# Patient Record
Sex: Female | Born: 1965 | Race: White | Hispanic: No | Marital: Married | Smoking: Current every day smoker
Health system: Southern US, Community
[De-identification: ages and names within clinical notes are randomized; demographics above are authoritative.]

## PROBLEM LIST (undated history)

## (undated) DIAGNOSIS — M069 Rheumatoid arthritis, unspecified: Secondary | ICD-10-CM

## (undated) DIAGNOSIS — F419 Anxiety disorder, unspecified: Secondary | ICD-10-CM

## (undated) DIAGNOSIS — M1712 Unilateral primary osteoarthritis, left knee: Secondary | ICD-10-CM

## (undated) DIAGNOSIS — K219 Gastro-esophageal reflux disease without esophagitis: Secondary | ICD-10-CM

## (undated) DIAGNOSIS — G43909 Migraine, unspecified, not intractable, without status migrainosus: Secondary | ICD-10-CM

## (undated) DIAGNOSIS — I509 Heart failure, unspecified: Secondary | ICD-10-CM

## (undated) DIAGNOSIS — G4730 Sleep apnea, unspecified: Secondary | ICD-10-CM

## (undated) DIAGNOSIS — R51 Headache: Secondary | ICD-10-CM

## (undated) DIAGNOSIS — M797 Fibromyalgia: Secondary | ICD-10-CM

## (undated) DIAGNOSIS — Z9289 Personal history of other medical treatment: Secondary | ICD-10-CM

## (undated) DIAGNOSIS — I10 Essential (primary) hypertension: Secondary | ICD-10-CM

## (undated) DIAGNOSIS — F32A Depression, unspecified: Secondary | ICD-10-CM

## (undated) DIAGNOSIS — E785 Hyperlipidemia, unspecified: Secondary | ICD-10-CM

## (undated) DIAGNOSIS — J449 Chronic obstructive pulmonary disease, unspecified: Secondary | ICD-10-CM

## (undated) DIAGNOSIS — K589 Irritable bowel syndrome without diarrhea: Secondary | ICD-10-CM

## (undated) DIAGNOSIS — N390 Urinary tract infection, site not specified: Secondary | ICD-10-CM

## (undated) DIAGNOSIS — M359 Systemic involvement of connective tissue, unspecified: Secondary | ICD-10-CM

## (undated) DIAGNOSIS — F329 Major depressive disorder, single episode, unspecified: Secondary | ICD-10-CM

## (undated) DIAGNOSIS — L439 Lichen planus, unspecified: Secondary | ICD-10-CM

## (undated) DIAGNOSIS — E119 Type 2 diabetes mellitus without complications: Secondary | ICD-10-CM

## (undated) HISTORY — DX: Major depressive disorder, single episode, unspecified: F32.9

## (undated) HISTORY — DX: Gastro-esophageal reflux disease without esophagitis: K21.9

## (undated) HISTORY — DX: Chronic obstructive pulmonary disease, unspecified: J44.9

## (undated) HISTORY — DX: Unilateral primary osteoarthritis, left knee: M17.12

## (undated) HISTORY — DX: Essential (primary) hypertension: I10

## (undated) HISTORY — DX: Migraine, unspecified, not intractable, without status migrainosus: G43.909

## (undated) HISTORY — DX: Depression, unspecified: F32.A

## (undated) HISTORY — DX: Urinary tract infection, site not specified: N39.0

## (undated) HISTORY — DX: Fibromyalgia: M79.7

## (undated) HISTORY — PX: VAGINAL HYSTERECTOMY: SUR661

## (undated) HISTORY — DX: Type 2 diabetes mellitus without complications: E11.9

## (undated) HISTORY — DX: Lichen planus, unspecified: L43.9

## (undated) HISTORY — DX: Rheumatoid arthritis, unspecified: M06.9

## (undated) HISTORY — PX: CATARACT EXTRACTION: SUR2

## (undated) HISTORY — DX: Hyperlipidemia, unspecified: E78.5

## (undated) HISTORY — PX: TUBAL LIGATION: SHX77

---

## 2009-06-18 ENCOUNTER — Ambulatory Visit: Payer: Self-pay | Admitting: Cardiology

## 2011-09-22 HISTORY — PX: KNEE ARTHROSCOPY: SUR90

## 2012-04-27 ENCOUNTER — Encounter (HOSPITAL_COMMUNITY): Payer: Self-pay | Admitting: Pharmacy Technician

## 2012-04-30 ENCOUNTER — Other Ambulatory Visit: Payer: Self-pay | Admitting: Physician Assistant

## 2012-04-30 ENCOUNTER — Encounter: Payer: Self-pay | Admitting: Physician Assistant

## 2012-04-30 DIAGNOSIS — K219 Gastro-esophageal reflux disease without esophagitis: Secondary | ICD-10-CM | POA: Insufficient documentation

## 2012-04-30 DIAGNOSIS — M797 Fibromyalgia: Secondary | ICD-10-CM

## 2012-04-30 DIAGNOSIS — M1712 Unilateral primary osteoarthritis, left knee: Secondary | ICD-10-CM

## 2012-04-30 DIAGNOSIS — F329 Major depressive disorder, single episode, unspecified: Secondary | ICD-10-CM

## 2012-04-30 DIAGNOSIS — F32A Depression, unspecified: Secondary | ICD-10-CM

## 2012-04-30 DIAGNOSIS — I10 Essential (primary) hypertension: Secondary | ICD-10-CM | POA: Insufficient documentation

## 2012-04-30 NOTE — H&P (Signed)
Stephanie Huang is an 46 y.o. female.   Chief Complaint: left knee pain HPI: Stephanie Huang is a 46 year-old seen for follow up evaluation from her persistent significant left knee pain.  She underwent left knee MRI 2 weeks ago that showed significant increased medial compartment DJD with persistent irregularity in the medial meniscus, but no significant damage in the patellofemoral joint or the lateral compartment.  This confirmed what we had found at the time of her knee arthroscopy, which was done in July of 2013.  She is having pain on a daily basis.  She is using a knee brace and a cane.  Taking Mobic and Norco.  Past Medical History  Diagnosis Date  . Hypertension   . Depression   . Fibromyalgia syndrome   . GERD (gastroesophageal reflux disease)   . Left knee DJD     Past Surgical History  Procedure Date  . Vaginal hysterectomy   . Cesarean section   . Tubal ligation     No family history on file. Social History:  does not have a smoking history on file. She does not have any smokeless tobacco history on file. Her alcohol and drug histories not on file.  Allergies:  Allergies  Allergen Reactions  . Lyrica (Pregabalin) Nausea And Vomiting   Current Outpatient Prescriptions on File Prior to Visit  Medication Sig Dispense Refill  . citalopram (CELEXA) 20 MG tablet Take 20 mg by mouth daily.      . cyclobenzaprine (FLEXERIL) 10 MG tablet Take 10 mg by mouth 3 (three) times daily as needed. For muscle spasms      . HYDROcodone-acetaminophen (NORCO/VICODIN) 5-325 MG per tablet Take 1 tablet by mouth every 6 (six) hours as needed. For pain      . lisinopril-hydrochlorothiazide (PRINZIDE,ZESTORETIC) 20-12.5 MG per tablet Take 1 tablet by mouth daily.      . meloxicam (MOBIC) 15 MG tablet Take 15 mg by mouth daily.      . metoprolol succinate (TOPROL-XL) 50 MG 24 hr tablet Take 50 mg by mouth daily. Take with or immediately following a meal.      . norethindrone (AYGESTIN) 5 MG  tablet Take 5 mg by mouth daily.      . omeprazole (PRILOSEC) 20 MG capsule Take 20 mg by mouth daily.      . Potassium (POTASSIMIN PO) Take 1 tablet by mouth daily.      . topiramate (TOPAMAX) 100 MG tablet Take 200 mg by mouth daily.      . zolpidem (AMBIEN) 10 MG tablet Take 10 mg by mouth at bedtime as needed.        (Not in a hospital admission)  No results found for this or any previous visit (from the past 48 hour(s)). No results found.  Review of Systems  Constitutional: Negative.   HENT: Negative.   Eyes: Negative.   Respiratory: Negative.   Cardiovascular: Negative.   Gastrointestinal: Negative.   Genitourinary: Negative.   Musculoskeletal: Positive for back pain and joint pain.       Low back and left knee pain  Skin: Negative.   Neurological: Negative.   Endo/Heme/Allergies: Negative.   Psychiatric/Behavioral: Negative.     Blood pressure 107/70, pulse 92, temperature 97.5 F (36.4 C), height 5' 5" (1.651 m), weight 120.203 kg (265 lb), SpO2 98.00%. Physical Exam  Constitutional: She is oriented to person, place, and time. She appears well-developed and well-nourished.  HENT:  Head: Normocephalic and atraumatic.  Eyes:   Conjunctivae normal and EOM are normal. Pupils are equal, round, and reactive to light.  Neck: Neck supple.  Cardiovascular: Normal rate and regular rhythm.   Respiratory: Effort normal.  GI: Soft.  Musculoskeletal:       Alert and oriented.  Examination of her left knee reveals pain medially.  1+ effusion.  Range of motion 0-100 degrees.  Knee is stable with normal patella tracking.  Examination of the right knee reveals full range of motion without pain, swelling, weakness or instability.  Vascular exam: Pulses are 2+ and symmetric.    Neurological: She is alert and oriented to person, place, and time. She has normal reflexes.  Skin: Skin is warm and dry.  Psychiatric: She has a normal mood and affect. Her behavior is normal.      Assessment Patient Active Problem List  Diagnosis  . Hypertension  . Depression  . Fibromyalgia syndrome  . GERD (gastroesophageal reflux disease)  . Left knee DJD      Plan I have talked to her about this in detail.  I do think with these findings, her significant pain and lack of response to conservative care that she would be an excellent candidate for a unicompartmental arthroplasty.  Risks, complications and benefits of the surgery have been described to her in detail and she understands this completely.  We are awaiting clearance from Dr Terry Daniel in [place].  She will go home with home health after surgery.   Merrin Mcvicker J 04/30/2012, 9:36 AM    

## 2012-05-01 NOTE — Pre-Procedure Instructions (Signed)
Stephanie Huang  05/01/2012   Your procedure is scheduled on:  05/10/12  Report to [facility] at530 AM.  Call this number if you have problems the morning of surgery: [phone]   Remember:   Do not eat food or drink liquids after midnight.   Take these medicines the morning of surgery with A SIP OF WATER: pain med,celexa,flexeril, metoprolol, prilosec STOP  meloxicam now   Do not wear jewelry, make-up or nail polish.  Do not wear lotions, powders, or perfumes. You may wear deodorant.  Do not shave 48 hours prior to surgery. Men may shave face and neck.  Do not bring valuables to the hospital.  Contacts, dentures or bridgework may not be worn into surgery.  Leave suitcase in the car. After surgery it may be brought to your room.  For patients admitted to the hospital, checkout time is 11:00 AM the day of  discharge.   Patients discharged the day of surgery will not be allowed to drive  home.  Name and phone number of your driver:   Special Instructions: Incentive Spirometry - Practice and bring it with you on the day of surgery. Shower using CHG 2 nights before surgery and the night before surgery.  If you shower the day of surgery use CHG.  Use special wash - you have one bottle of CHG for all showers.  You should use approximately 1/3 of the bottle for each shower.   Please read over the following fact sheets that you were given: Pain Booklet, Coughing and Deep Breathing, Blood Transfusion Information, Total Joint Packet, MRSA Information and Surgical Site Infection Prevention

## 2012-05-03 ENCOUNTER — Encounter (HOSPITAL_COMMUNITY)
Admission: RE | Admit: 2012-05-03 | Discharge: 2012-05-03 | Disposition: A | Discharge status: Home / Self Care | Payer: No Typology Code available for payment source | Source: Ambulatory Visit | Attending: Physician Assistant | Admitting: Physician Assistant

## 2012-05-03 ENCOUNTER — Encounter (HOSPITAL_COMMUNITY)
Admission: RE | Admit: 2012-05-03 | Discharge: 2012-05-03 | Disposition: A | Discharge status: Home / Self Care | Payer: No Typology Code available for payment source | Source: Ambulatory Visit | Attending: Orthopedic Surgery | Admitting: Orthopedic Surgery

## 2012-05-03 ENCOUNTER — Encounter (HOSPITAL_COMMUNITY): Payer: Self-pay

## 2012-05-03 HISTORY — DX: Irritable bowel syndrome, unspecified: K58.9

## 2012-05-03 HISTORY — DX: Fibromyalgia: M79.7

## 2012-05-03 HISTORY — DX: Anxiety disorder, unspecified: F41.9

## 2012-05-03 HISTORY — DX: Personal history of other medical treatment: Z92.89

## 2012-05-03 HISTORY — DX: Headache: R51

## 2012-05-03 LAB — CBC WITH DIFFERENTIAL/PLATELET
Basophils Absolute: 0 10*3/uL (ref 0.0–0.1)
Basophils Relative: 0 % (ref 0–1)
Eosinophils Relative: 2 % (ref 0–5)
HCT: 45.1 % (ref 36.0–46.0)
MCHC: 34.1 g/dL (ref 30.0–36.0)
MCV: 90.2 fL (ref 78.0–100.0)
Monocytes Absolute: 0.8 10*3/uL (ref 0.1–1.0)
Platelets: 255 10*3/uL (ref 150–400)
RDW: 12.9 % (ref 11.5–15.5)

## 2012-05-03 LAB — COMPREHENSIVE METABOLIC PANEL
ALT: 22 U/L (ref 0–35)
AST: 18 U/L (ref 0–37)
Calcium: 9.3 mg/dL (ref 8.4–10.5)
Sodium: 138 mEq/L (ref 135–145)
Total Protein: 7.6 g/dL (ref 6.0–8.3)

## 2012-05-03 LAB — URINALYSIS, ROUTINE W REFLEX MICROSCOPIC
Hgb urine dipstick: NEGATIVE
Ketones, ur: NEGATIVE mg/dL
Leukocytes, UA: NEGATIVE
Protein, ur: NEGATIVE mg/dL
Urobilinogen, UA: 0.2 mg/dL (ref 0.0–1.0)

## 2012-05-03 LAB — TYPE AND SCREEN
ABO/RH(D): O POS
Antibody Screen: NEGATIVE

## 2012-05-03 LAB — PROTIME-INR: INR: 0.95 (ref 0.00–1.49)

## 2012-05-03 LAB — APTT: aPTT: 30 seconds (ref 24–37)

## 2012-05-03 LAB — ABO/RH: ABO/RH(D): O POS

## 2012-05-03 LAB — SURGICAL PCR SCREEN: MRSA, PCR: NEGATIVE

## 2012-05-03 NOTE — Progress Notes (Addendum)
Did not receive an EKG from [facility].. Pt states that she has had one at Dr Rocco Pauls office in [place].  I faxed a request for results and last office note.  Pt denies having a cardiologist. Denies chest pain.  EKG today shows Septal Infart- age undetermined.  I left chart for [employer].

## 2012-05-03 NOTE — Progress Notes (Signed)
Pt states she had an EKG and Echo at [facility] in 2012, I faxed a request for these.

## 2012-05-04 NOTE — Consult Note (Signed)
Anesthesia chart review: Patient is a 47 year old female scheduled for left knee unicompartmental replacement by Dr. Thurston Hole on 05/10/12.  History includes former smoker, morbid obesity, HTN, GERD, fibromyalgia, depression, IBS, anxiety, headaches, history of transfusion, hysterectomy.  PCP is Dr. Donzetta Sprung, who is aware of planned procedure.    EKG on 05/03/12 showed NSR, septal infarct (age undetermined).  I called, and currently there are no available EKGs at [facility] or at her PCP office. There is no documented history of CAD/MI/CHF or DM and no CV symptoms were documented at her PAT visit.    Echo on 06/18/09 showed atrial septal aneurysm, mild concentric LVH, global left ventricular wall motion and contractility are within normal limits, EF 55-60%, mild mitral regurgitation, trace tricuspid regurgitation.  CXR on 05/03/12 showed n acute infiltrate or pulmonary edema. Mild basilar atelectasis. Borderline cardiomegaly.   Preoperative labs noted.  Her UA was WNL.  The urine culture order was not released, so it will have to be done on the day of surgery or sooner if requested by Dr. Sherene Sires office.  (I've asked Short Stay staff to notify his office.)  If no significant changes or new CV symptoms then would anticipate she could proceed as planned.  She will be evaluated by her assigned anesthesiologist on the day of surgery.  Shonna Chock, PA-C 05/04/12 1504

## 2012-05-05 NOTE — Progress Notes (Signed)
Left a voice message for Stephanie Huang at Dr.[facility]'s office that urine culture wasn't done and will need to be done on arrival unless they need her to come back

## 2012-05-05 NOTE — Progress Notes (Signed)
Spoke with Cordelia Pen in Dr. Sherene Sires office.  She will call patient to ask them to go to [facility] to give urine specimen for culture.  Urine culture was ordered and released at pre admit appointment but did not cross over to lab.Sherry informed of this.

## 2012-05-09 MED ORDER — DEXTROSE 5 % IV SOLN
3.0000 g | INTRAVENOUS | Status: AC
  Administered 2012-05-10: 3 g via INTRAVENOUS
  Filled 2012-05-09: qty 3000

## 2012-05-09 MED ORDER — LACTATED RINGERS IV SOLN: INTRAVENOUS | Status: DC

## 2012-05-09 MED ORDER — CHLORHEXIDINE GLUCONATE 4 % EX LIQD: 60.0000 mL | Freq: Once | CUTANEOUS | Status: DC

## 2012-05-09 MED ORDER — POVIDONE-IODINE 7.5 % EX SOLN
Freq: Once | CUTANEOUS | Status: DC
  Filled 2012-05-09: qty 118

## 2012-05-10 ENCOUNTER — Inpatient Hospital Stay (HOSPITAL_COMMUNITY)
Admission: RE | Admit: 2012-05-10 | Discharge: 2012-05-11 | DRG: 470 | Disposition: A | Discharge status: Home Health | Payer: No Typology Code available for payment source | Source: Ambulatory Visit | Attending: Orthopedic Surgery | Admitting: Orthopedic Surgery

## 2012-05-10 ENCOUNTER — Encounter (HOSPITAL_COMMUNITY)
Admission: RE | Disposition: A | Discharge status: Home Health | Payer: Self-pay | Source: Ambulatory Visit | Attending: Orthopedic Surgery

## 2012-05-10 ENCOUNTER — Encounter (HOSPITAL_COMMUNITY): Payer: Self-pay | Admitting: *Deleted

## 2012-05-10 ENCOUNTER — Encounter (HOSPITAL_COMMUNITY): Payer: Self-pay | Admitting: Vascular Surgery

## 2012-05-10 ENCOUNTER — Inpatient Hospital Stay (HOSPITAL_COMMUNITY): Payer: No Typology Code available for payment source | Admitting: Vascular Surgery

## 2012-05-10 DIAGNOSIS — M1712 Unilateral primary osteoarthritis, left knee: Secondary | ICD-10-CM | POA: Diagnosis present

## 2012-05-10 DIAGNOSIS — Z888 Allergy status to other drugs, medicaments and biological substances status: Secondary | ICD-10-CM

## 2012-05-10 DIAGNOSIS — I10 Essential (primary) hypertension: Secondary | ICD-10-CM | POA: Diagnosis present

## 2012-05-10 DIAGNOSIS — Z90710 Acquired absence of both cervix and uterus: Secondary | ICD-10-CM

## 2012-05-10 DIAGNOSIS — M25469 Effusion, unspecified knee: Secondary | ICD-10-CM | POA: Diagnosis present

## 2012-05-10 DIAGNOSIS — F1920 Other psychoactive substance dependence, uncomplicated: Secondary | ICD-10-CM | POA: Diagnosis present

## 2012-05-10 DIAGNOSIS — Z79899 Other long term (current) drug therapy: Secondary | ICD-10-CM

## 2012-05-10 DIAGNOSIS — F3289 Other specified depressive episodes: Secondary | ICD-10-CM | POA: Diagnosis present

## 2012-05-10 DIAGNOSIS — K219 Gastro-esophageal reflux disease without esophagitis: Secondary | ICD-10-CM | POA: Diagnosis present

## 2012-05-10 DIAGNOSIS — F32A Depression, unspecified: Secondary | ICD-10-CM | POA: Diagnosis present

## 2012-05-10 DIAGNOSIS — M1710 Unilateral primary osteoarthritis, unspecified knee: Principal | ICD-10-CM | POA: Diagnosis present

## 2012-05-10 DIAGNOSIS — IMO0001 Reserved for inherently not codable concepts without codable children: Secondary | ICD-10-CM | POA: Diagnosis present

## 2012-05-10 DIAGNOSIS — Z87891 Personal history of nicotine dependence: Secondary | ICD-10-CM

## 2012-05-10 DIAGNOSIS — M797 Fibromyalgia: Secondary | ICD-10-CM | POA: Diagnosis present

## 2012-05-10 DIAGNOSIS — F329 Major depressive disorder, single episode, unspecified: Secondary | ICD-10-CM | POA: Diagnosis present

## 2012-05-10 DIAGNOSIS — F411 Generalized anxiety disorder: Secondary | ICD-10-CM | POA: Diagnosis present

## 2012-05-10 HISTORY — PX: PARTIAL KNEE ARTHROPLASTY: SHX2174

## 2012-05-10 LAB — CBC
HCT: 44.3 % (ref 36.0–46.0)
Hemoglobin: 15.1 g/dL — ABNORMAL HIGH (ref 12.0–15.0)
MCH: 30.8 pg (ref 26.0–34.0)
MCV: 90.2 fL (ref 78.0–100.0)
Platelets: 242 10*3/uL (ref 150–400)
RBC: 4.91 MIL/uL (ref 3.87–5.11)
WBC: 17.3 10*3/uL — ABNORMAL HIGH (ref 4.0–10.5)

## 2012-05-10 LAB — CREATININE, SERUM: GFR calc Af Amer: >90 mL/min (ref >90)

## 2012-05-10 SURGERY — ARTHROPLASTY, KNEE, UNICOMPARTMENTAL
Anesthesia: General | Site: Knee | Laterality: Left | Wound class: Clean

## 2012-05-10 MED ORDER — ONDANSETRON HCL 4 MG PO TABS: 4.0000 mg | ORAL_TABLET | Freq: Four times a day (QID) | ORAL | Status: DC | PRN

## 2012-05-10 MED ORDER — METOPROLOL SUCCINATE ER 50 MG PO TB24
50.0000 mg | ORAL_TABLET | Freq: Every day | ORAL | Status: DC
  Administered 2012-05-11: 50 mg via ORAL
  Filled 2012-05-10: qty 1

## 2012-05-10 MED ORDER — CEFUROXIME SODIUM 1.5 G IJ SOLR
INTRAMUSCULAR | Status: DC | PRN
  Administered 2012-05-10: 1.5 g

## 2012-05-10 MED ORDER — BUPIVACAINE-EPINEPHRINE 0.5% -1:200000 IJ SOLN
INTRAMUSCULAR | Status: DC | PRN
  Administered 2012-05-10: 30 mL

## 2012-05-10 MED ORDER — OXYCODONE HCL 5 MG/5ML PO SOLN: 5.0000 mg | Freq: Once | ORAL | Status: DC | PRN

## 2012-05-10 MED ORDER — HYDROMORPHONE HCL PF 1 MG/ML IJ SOLN
0.2500 mg | INTRAMUSCULAR | Status: DC | PRN
  Administered 2012-05-10 (×3): 0.5 mg via INTRAVENOUS

## 2012-05-10 MED ORDER — PHENOL 1.4 % MT LIQD: 1.0000 | OROMUCOSAL | Status: DC | PRN

## 2012-05-10 MED ORDER — ONDANSETRON HCL 4 MG/2ML IJ SOLN
INTRAMUSCULAR | Status: DC | PRN
  Administered 2012-05-10: 4 mg via INTRAVENOUS

## 2012-05-10 MED ORDER — OXYCODONE HCL 5 MG PO TABS: 5.0000 mg | ORAL_TABLET | Freq: Once | ORAL | Status: DC | PRN

## 2012-05-10 MED ORDER — ZOLPIDEM TARTRATE 5 MG PO TABS
5.0000 mg | ORAL_TABLET | Freq: Every evening | ORAL | Status: DC | PRN
  Administered 2012-05-10: 5 mg via ORAL
  Filled 2012-05-10: qty 1

## 2012-05-10 MED ORDER — MENTHOL 3 MG MT LOZG: 1.0000 | LOZENGE | OROMUCOSAL | Status: DC | PRN

## 2012-05-10 MED ORDER — HYDROMORPHONE HCL PF 1 MG/ML IJ SOLN
INTRAMUSCULAR | Status: AC
  Filled 2012-05-10: qty 2

## 2012-05-10 MED ORDER — BUPIVACAINE-EPINEPHRINE PF 0.5-1:200000 % IJ SOLN
INTRAMUSCULAR | Status: DC | PRN
  Administered 2012-05-10: 150 mg

## 2012-05-10 MED ORDER — ENOXAPARIN SODIUM 30 MG/0.3ML ~~LOC~~ SOLN
30.0000 mg | Freq: Two times a day (BID) | SUBCUTANEOUS | Status: DC
  Administered 2012-05-11: 30 mg via SUBCUTANEOUS
  Filled 2012-05-10 (×3): qty 0.3

## 2012-05-10 MED ORDER — DEXAMETHASONE SODIUM PHOSPHATE 10 MG/ML IJ SOLN
10.0000 mg | Freq: Once | INTRAMUSCULAR | Status: AC
  Administered 2012-05-10: 10 mg via INTRAVENOUS

## 2012-05-10 MED ORDER — ACETAMINOPHEN 10 MG/ML IV SOLN
INTRAVENOUS | Status: AC
  Administered 2012-05-10: 1000 mg via INTRAVENOUS
  Filled 2012-05-10: qty 100

## 2012-05-10 MED ORDER — BUPIVACAINE-EPINEPHRINE (PF) 0.5% -1:200000 IJ SOLN
INTRAMUSCULAR | Status: AC
  Filled 2012-05-10: qty 10

## 2012-05-10 MED ORDER — HYDROMORPHONE HCL PF 1 MG/ML IJ SOLN
0.5000 mg | INTRAMUSCULAR | Status: DC | PRN
  Administered 2012-05-10 – 2012-05-11 (×3): 1 mg via INTRAVENOUS
  Filled 2012-05-10 (×3): qty 1

## 2012-05-10 MED ORDER — MIDAZOLAM HCL 2 MG/2ML IJ SOLN
INTRAMUSCULAR | Status: AC
  Filled 2012-05-10: qty 2

## 2012-05-10 MED ORDER — CITALOPRAM HYDROBROMIDE 20 MG PO TABS
20.0000 mg | ORAL_TABLET | Freq: Every day | ORAL | Status: DC
  Administered 2012-05-11: 20 mg via ORAL
  Filled 2012-05-10: qty 1

## 2012-05-10 MED ORDER — ONDANSETRON HCL 4 MG/2ML IJ SOLN: 4.0000 mg | Freq: Four times a day (QID) | INTRAMUSCULAR | Status: DC | PRN

## 2012-05-10 MED ORDER — ACETAMINOPHEN 10 MG/ML IV SOLN
1000.0000 mg | Freq: Four times a day (QID) | INTRAVENOUS | Status: DC
  Administered 2012-05-10 – 2012-05-11 (×3): 1000 mg via INTRAVENOUS
  Filled 2012-05-10 (×4): qty 100

## 2012-05-10 MED ORDER — 0.9 % SODIUM CHLORIDE (POUR BTL) OPTIME
TOPICAL | Status: DC | PRN
  Administered 2012-05-10: 1000 mL

## 2012-05-10 MED ORDER — TOPIRAMATE 100 MG PO TABS
200.0000 mg | ORAL_TABLET | Freq: Every day | ORAL | Status: DC
  Administered 2012-05-10 – 2012-05-11 (×2): 200 mg via ORAL
  Filled 2012-05-10 (×2): qty 2

## 2012-05-10 MED ORDER — POTASSIUM CHLORIDE CRYS ER 10 MEQ PO TBCR
10.0000 meq | EXTENDED_RELEASE_TABLET | Freq: Every day | ORAL | Status: DC
  Administered 2012-05-11: 10 meq via ORAL
  Filled 2012-05-10: qty 1

## 2012-05-10 MED ORDER — BISACODYL 5 MG PO TBEC
10.0000 mg | DELAYED_RELEASE_TABLET | Freq: Every day | ORAL | Status: DC
  Administered 2012-05-11: 10 mg via ORAL
  Filled 2012-05-10: qty 2

## 2012-05-10 MED ORDER — CEFAZOLIN SODIUM-DEXTROSE 2-3 GM-% IV SOLR
2.0000 g | Freq: Four times a day (QID) | INTRAVENOUS | Status: AC
  Administered 2012-05-10 (×2): 2 g via INTRAVENOUS
  Filled 2012-05-10 (×2): qty 50

## 2012-05-10 MED ORDER — FENTANYL CITRATE 0.05 MG/ML IJ SOLN
INTRAMUSCULAR | Status: DC | PRN
  Administered 2012-05-10: 50 ug via INTRAVENOUS
  Administered 2012-05-10: 100 ug via INTRAVENOUS
  Administered 2012-05-10 (×2): 50 ug via INTRAVENOUS

## 2012-05-10 MED ORDER — CELECOXIB 200 MG PO CAPS
200.0000 mg | ORAL_CAPSULE | Freq: Two times a day (BID) | ORAL | Status: DC
  Administered 2012-05-10 – 2012-05-11 (×3): 200 mg via ORAL
  Filled 2012-05-10 (×4): qty 1

## 2012-05-10 MED ORDER — CYCLOBENZAPRINE HCL 10 MG PO TABS: 10.0000 mg | ORAL_TABLET | Freq: Three times a day (TID) | ORAL | Status: DC | PRN

## 2012-05-10 MED ORDER — ACETAMINOPHEN 325 MG PO TABS: 650.0000 mg | ORAL_TABLET | Freq: Four times a day (QID) | ORAL | Status: DC | PRN

## 2012-05-10 MED ORDER — LISINOPRIL 20 MG PO TABS
20.0000 mg | ORAL_TABLET | Freq: Every day | ORAL | Status: DC
  Administered 2012-05-11: 20 mg via ORAL
  Filled 2012-05-10: qty 1

## 2012-05-10 MED ORDER — DEXAMETHASONE 6 MG PO TABS
10.0000 mg | ORAL_TABLET | Freq: Three times a day (TID) | ORAL | Status: AC
  Administered 2012-05-10 – 2012-05-11 (×3): 10 mg via ORAL
  Filled 2012-05-10 (×3): qty 1

## 2012-05-10 MED ORDER — METOCLOPRAMIDE HCL 5 MG/ML IJ SOLN: 5.0000 mg | Freq: Three times a day (TID) | INTRAMUSCULAR | Status: DC | PRN

## 2012-05-10 MED ORDER — PANTOPRAZOLE SODIUM 40 MG PO TBEC
40.0000 mg | DELAYED_RELEASE_TABLET | Freq: Every day | ORAL | Status: DC
  Administered 2012-05-11: 40 mg via ORAL
  Filled 2012-05-10: qty 1

## 2012-05-10 MED ORDER — FENTANYL CITRATE 0.05 MG/ML IJ SOLN
INTRAMUSCULAR | Status: AC
  Filled 2012-05-10: qty 2

## 2012-05-10 MED ORDER — SODIUM CHLORIDE 0.9 % IR SOLN
Status: DC | PRN
  Administered 2012-05-10: 3000 mL

## 2012-05-10 MED ORDER — POTASSIUM CHLORIDE IN NACL 20-0.9 MEQ/L-% IV SOLN
INTRAVENOUS | Status: DC
  Administered 2012-05-10 (×2): via INTRAVENOUS
  Filled 2012-05-10 (×4): qty 1000

## 2012-05-10 MED ORDER — HYDROMORPHONE HCL PF 1 MG/ML IJ SOLN
INTRAMUSCULAR | Status: AC
  Filled 2012-05-10: qty 1

## 2012-05-10 MED ORDER — PROPOFOL 10 MG/ML IV BOLUS
INTRAVENOUS | Status: DC | PRN
  Administered 2012-05-10: 200 mg via INTRAVENOUS

## 2012-05-10 MED ORDER — DEXAMETHASONE SODIUM PHOSPHATE 10 MG/ML IJ SOLN
10.0000 mg | Freq: Three times a day (TID) | INTRAMUSCULAR | Status: AC
  Filled 2012-05-10 (×3): qty 1

## 2012-05-10 MED ORDER — CEFUROXIME SODIUM 1.5 G IJ SOLR
INTRAMUSCULAR | Status: AC
  Filled 2012-05-10: qty 1.5

## 2012-05-10 MED ORDER — LIDOCAINE HCL (CARDIAC) 20 MG/ML IV SOLN
INTRAVENOUS | Status: DC | PRN
  Administered 2012-05-10: 100 mg via INTRAVENOUS

## 2012-05-10 MED ORDER — DIPHENHYDRAMINE HCL 12.5 MG/5ML PO ELIX: 12.5000 mg | ORAL_SOLUTION | ORAL | Status: DC | PRN

## 2012-05-10 MED ORDER — METOCLOPRAMIDE HCL 10 MG PO TABS: 5.0000 mg | ORAL_TABLET | Freq: Three times a day (TID) | ORAL | Status: DC | PRN

## 2012-05-10 MED ORDER — ALUM & MAG HYDROXIDE-SIMETH 200-200-20 MG/5ML PO SUSP: 30.0000 mL | ORAL | Status: DC | PRN

## 2012-05-10 MED ORDER — ACETAMINOPHEN 650 MG RE SUPP: 650.0000 mg | Freq: Four times a day (QID) | RECTAL | Status: DC | PRN

## 2012-05-10 MED ORDER — DOCUSATE SODIUM 100 MG PO CAPS
100.0000 mg | ORAL_CAPSULE | Freq: Two times a day (BID) | ORAL | Status: DC
  Administered 2012-05-10 – 2012-05-11 (×2): 100 mg via ORAL
  Filled 2012-05-10 (×3): qty 1

## 2012-05-10 MED ORDER — PROMETHAZINE HCL 25 MG/ML IJ SOLN: 6.2500 mg | INTRAMUSCULAR | Status: DC | PRN

## 2012-05-10 MED ORDER — MIDAZOLAM HCL 5 MG/5ML IJ SOLN
INTRAMUSCULAR | Status: DC | PRN
  Administered 2012-05-10: 2 mg via INTRAVENOUS

## 2012-05-10 MED ORDER — LACTATED RINGERS IV SOLN
INTRAVENOUS | Status: DC | PRN
  Administered 2012-05-10 (×2): via INTRAVENOUS

## 2012-05-10 MED ORDER — OXYCODONE HCL 5 MG PO TABS
5.0000 mg | ORAL_TABLET | ORAL | Status: DC | PRN
  Administered 2012-05-11 (×3): 10 mg via ORAL
  Filled 2012-05-10 (×3): qty 2

## 2012-05-10 SURGICAL SUPPLY — 71 items
BANDAGE ELASTIC 4 VELCRO ST LF (GAUZE/BANDAGES/DRESSINGS) ×2 IMPLANT
BANDAGE ELASTIC 6 VELCRO ST LF (GAUZE/BANDAGES/DRESSINGS) ×2 IMPLANT
BANDAGE ESMARK 6X9 LF (GAUZE/BANDAGES/DRESSINGS) ×1 IMPLANT
BLADE SURG 10 STRL SS (BLADE) ×4 IMPLANT
BNDG ELASTIC 6X15 VLCR STRL LF (GAUZE/BANDAGES/DRESSINGS) ×2 IMPLANT
BNDG ESMARK 6X9 LF (GAUZE/BANDAGES/DRESSINGS) ×2
BOWL SMART MIX CTS (DISPOSABLE) ×2 IMPLANT
CEMENT HV SMART SET (Cement) ×2 IMPLANT
CLOTH BEACON ORANGE TIMEOUT ST (SAFETY) ×2 IMPLANT
COVER BACK TABLE 24X17X13 BIG (DRAPES) IMPLANT
COVER SURGICAL LIGHT HANDLE (MISCELLANEOUS) ×2 IMPLANT
CUFF TOURNIQUET SINGLE 34IN LL (TOURNIQUET CUFF) ×2 IMPLANT
CUFF TOURNIQUET SINGLE 44IN (TOURNIQUET CUFF) IMPLANT
DRAPE EXTREMITY T 121X128X90 (DRAPE) ×2 IMPLANT
DRAPE INCISE IOBAN 66X45 STRL (DRAPES) ×2 IMPLANT
DRAPE PROXIMA HALF (DRAPES) ×2 IMPLANT
DRAPE U-SHAPE 47X51 STRL (DRAPES) ×2 IMPLANT
DRSG ADAPTIC 3X8 NADH LF (GAUZE/BANDAGES/DRESSINGS) ×2 IMPLANT
DRSG PAD ABDOMINAL 8X10 ST (GAUZE/BANDAGES/DRESSINGS) ×4 IMPLANT
DURAPREP 26ML APPLICATOR (WOUND CARE) ×2 IMPLANT
ELECT CAUTERY BLADE 6.4 (BLADE) ×2 IMPLANT
ELECT REM PT RETURN 9FT ADLT (ELECTROSURGICAL) ×2
ELECTRODE REM PT RTRN 9FT ADLT (ELECTROSURGICAL) ×1 IMPLANT
EVACUATOR 1/8 PVC DRAIN (DRAIN) ×2 IMPLANT
FACESHIELD LNG OPTICON STERILE (SAFETY) ×6 IMPLANT
GLOVE BIO SURGEON STRL SZ7 (GLOVE) ×2 IMPLANT
GLOVE BIO SURGEON STRL SZ7.5 (GLOVE) ×2 IMPLANT
GLOVE BIOGEL PI IND STRL 6.5 (GLOVE) ×2 IMPLANT
GLOVE BIOGEL PI IND STRL 7.0 (GLOVE) ×2 IMPLANT
GLOVE BIOGEL PI IND STRL 7.5 (GLOVE) ×2 IMPLANT
GLOVE BIOGEL PI IND STRL 8 (GLOVE) ×2 IMPLANT
GLOVE BIOGEL PI INDICATOR 6.5 (GLOVE) ×2
GLOVE BIOGEL PI INDICATOR 7.0 (GLOVE) ×2
GLOVE BIOGEL PI INDICATOR 7.5 (GLOVE) ×2
GLOVE BIOGEL PI INDICATOR 8 (GLOVE) ×2
GLOVE BIOGEL PI ORTHO PRO 7.5 (GLOVE) ×2
GLOVE PI ORTHO PRO STRL 7.5 (GLOVE) ×2 IMPLANT
GLOVE SS BIOGEL STRL SZ 7.5 (GLOVE) ×1 IMPLANT
GLOVE SUPERSENSE BIOGEL SZ 7.5 (GLOVE) ×1
GLOVE SURG SS PI 7.0 STRL IVOR (GLOVE) ×2 IMPLANT
GOWN BRE IMP PREV XXLGXLNG (GOWN DISPOSABLE) ×2 IMPLANT
GOWN PREVENTION PLUS XLARGE (GOWN DISPOSABLE) ×4 IMPLANT
GOWN STRL NON-REIN LRG LVL3 (GOWN DISPOSABLE) ×6 IMPLANT
HANDPIECE INTERPULSE COAX TIP (DISPOSABLE) ×1
HOOD PEEL AWAY FACE SHEILD DIS (HOOD) ×6 IMPLANT
IMMOBILIZER KNEE 22 UNIV (SOFTGOODS) IMPLANT
KIT BASIN OR (CUSTOM PROCEDURE TRAY) ×2 IMPLANT
KIT ROOM TURNOVER OR (KITS) ×2 IMPLANT
KIT SAW BLADE (KITS) ×2 IMPLANT
MANIFOLD NEPTUNE II (INSTRUMENTS) ×2 IMPLANT
NS IRRIG 1000ML POUR BTL (IV SOLUTION) ×2 IMPLANT
PACK TOTAL JOINT (CUSTOM PROCEDURE TRAY) ×2 IMPLANT
PAD ARMBOARD 7.5X6 YLW CONV (MISCELLANEOUS) ×4 IMPLANT
PAD CAST 4YDX4 CTTN HI CHSV (CAST SUPPLIES) ×1 IMPLANT
PADDING CAST COTTON 4X4 STRL (CAST SUPPLIES) ×1
PADDING CAST COTTON 6X4 STRL (CAST SUPPLIES) ×2 IMPLANT
RUBBERBAND STERILE (MISCELLANEOUS) ×2 IMPLANT
SET HNDPC FAN SPRY TIP SCT (DISPOSABLE) ×1 IMPLANT
SPONGE GAUZE 4X4 12PLY (GAUZE/BANDAGES/DRESSINGS) ×2 IMPLANT
STRIP CLOSURE SKIN 1/2X4 (GAUZE/BANDAGES/DRESSINGS) ×4 IMPLANT
SUCTION FRAZIER TIP 10 FR DISP (SUCTIONS) ×2 IMPLANT
SUT ETHIBOND NAB CT1 #1 30IN (SUTURE) ×4 IMPLANT
SUT MNCRL AB 3-0 PS2 18 (SUTURE) ×2 IMPLANT
SUT VIC AB 0 CT1 27 (SUTURE) ×2
SUT VIC AB 0 CT1 27XBRD ANBCTR (SUTURE) ×2 IMPLANT
SUT VIC AB 2-0 CT1 27 (SUTURE) ×2
SUT VIC AB 2-0 CT1 TAPERPNT 27 (SUTURE) ×2 IMPLANT
TOWEL OR 17X24 6PK STRL BLUE (TOWEL DISPOSABLE) ×2 IMPLANT
TOWEL OR 17X26 10 PK STRL BLUE (TOWEL DISPOSABLE) ×2 IMPLANT
TRAY FOLEY CATH 14FR (SET/KITS/TRAYS/PACK) ×2 IMPLANT
WATER STERILE IRR 1000ML POUR (IV SOLUTION) ×6 IMPLANT

## 2012-05-10 NOTE — Anesthesia Procedure Notes (Signed)
Anesthesia Regional Block:  Femoral nerve block  Pre-Anesthetic Checklist: ,, timeout performed, Correct Patient, Correct Site, Correct Laterality, Correct Procedure, Correct Position, site marked, Risks and benefits discussed,  Surgical consent,  Pre-op evaluation,  At surgeon's request and post-op pain management  Laterality: Left  Prep: chloraprep       Needles:  Injection technique: Single-shot  Needle Type: Echogenic Stimulator Needle     Needle Length: 5cm 5 cm Needle Gauge: 22 and 22 G    Additional Needles:  Procedures: ultrasound guided (picture in chart) and nerve stimulator Femoral nerve block  Nerve Stimulator or Paresthesia:  Response: quadraceps contraction, 0.45 mA,   Additional Responses:   Narrative:  Start time: 05/10/2012 7:00 AM End time: 05/10/2012 7:10 AM Injection made incrementally with aspirations every 5 mL.  Performed by: Personally  Anesthesiologist: Halford Decamp, MD  Additional Notes: Functioning IV was confirmed and monitors were applied.  A 50mm 22ga Arrow echogenic stimulator needle was used. Sterile prep and drape,hand hygiene and sterile gloves were used. Ultrasound guidance: relevant anatomy identified, needle position confirmed, local anesthetic spread visualized around nerve(s)., vascular puncture avoided.  Image printed for medical record. Negative aspiration and negative test dose prior to incremental administration of local anesthetic. The patient tolerated the procedure well.    Femoral nerve block

## 2012-05-10 NOTE — Interval H&P Note (Signed)
History and Physical Interval Note:  05/10/2012 7:02 AM  Stephanie Huang  has presented today for surgery, with the diagnosis of DJD LEFT KNEE  The various methods of treatment have been discussed with the patient and family. After consideration of risks, benefits and other options for treatment, the patient has consented to  Procedure(s): UNICOMPARTMENTAL KNEE (Left) as a surgical intervention .  The patient's history has been reviewed, patient examined, no change in status, stable for surgery.  I have reviewed the patient's chart and labs.  Questions were answered to the patient's satisfaction.     Salvatore Marvel A

## 2012-05-10 NOTE — Transfer of Care (Signed)
Immediate Anesthesia Transfer of Care Note  Patient: Stephanie Huang  Procedure(s) Performed: Procedure(s): UNICOMPARTMENTAL KNEE (Left)  Patient Location: PACU  Anesthesia Type:General  Level of Consciousness: awake and alert   Airway & Oxygen Therapy: Patient Spontanous Breathing and Patient connected to face mask oxygen  Post-op Assessment: Report given to PACU RN, Post -op Vital signs reviewed and stable and Patient moving all extremities  Post vital signs: Reviewed and stable  Complications: No apparent anesthesia complications

## 2012-05-10 NOTE — Progress Notes (Signed)
Orthopedic Tech Progress Note Patient Details:  Stephanie Huang Sep 06, 1965 [id number]  CPM Left Knee CPM Left Knee: On Left Knee Flexion (Degrees): 60 Left Knee Extension (Degrees): 0 Additional Comments: TRAPEZE BAR   [name] 05/10/2012, 12:36 PM

## 2012-05-10 NOTE — Preoperative (Signed)
Beta Blockers   Reason not to administer Beta Blockers:B-blocker taken 05/10/12 @ 0400

## 2012-05-10 NOTE — Op Note (Signed)
NAMEMarland Kitchen  TERRIAN, RIDLON NO.:  [id number]  MEDICAL RECORD NO.:  [id number]  LOCATION:  [facility]                         FACILITY:  [facility]  PHYSICIAN:  Garin Mata A. Thurston Hole, M.D. DATE OF BIRTH:  11-16-1965  DATE OF PROCEDURE:  05/10/2012 DATE OF DISCHARGE:                              OPERATIVE REPORT   PREOPERATIVE DIAGNOSIS:  Left knee medial compartment degenerative joint disease.  POSTOPERATIVE DIAGNOSIS:  Left knee medial compartment degenerative joint disease.  PROCEDURE: 1. Left knee Oxford unicompartmental arthroplasty using extra-small     femoral component with size B tibial component with #3 polyethylene     tibial spacer. 2. Zinacef impregnated cement.  SURGEON:  Elana Alm. Thurston Hole, M.D.  ASSISTANT:  Eulas Post, MD and Julien Girt, PA-C.  ANESTHESIA:  General.  OPERATIVE TIME:  1 hour and 30 minutes.  COMPLICATIONS:  None.  DESCRIPTION OF PROCEDURE:  Ms. Sparr is brought to the operating room on May 10, 2012 after a femoral nerve block was placed only by Anesthesia.  She was placed on operative table in supine position.  She received Ancef 2 g IV preoperatively for prophylaxis.  After being placed under general anesthesia, she had a Foley catheter placed under sterile conditions.  Her left knee was examined.  Range of motion from - 5 to 125 degrees, mild varus deformity, knee stable, ligamentous exam with normal patellar tracking.  Left leg was prepped using sterile DuraPrep and draped using sterile technique.  Time-out of the procedure was called and the correct left knee identified.  Left leg was exsanguinated and a thigh tourniquet elevated to 375 mm.  Initially, through a 8-10 cm longitudinal incision based just medial to the patellar tendon and patella, initial exposure was made.  Underlying subcutaneous tissues were incised along with skin incision.  A median arthrotomy was performed revealing excessive amount of  normal-appearing joint fluid.  The articular surfaces were inspected.  She had grade 4 changes medially, grade 3 changes in the patellofemoral joint, and no articular cartilage damage laterally.  ACL and PCL were intact as well as the medial lateral collateral ligaments.  At this point, the medial meniscus was resected.  Then an extra-small sizing was placed in the medial gap and this was used as a jig to make the proximal tibial cut which was then made in the appropriate manner of rotation and inclination.  This proximal tibial bone piece was removed and measured for a size B in size.  After this was done, an intramedullary rod was placed up the femoral canal and used to a lengthy femoral jig and then these 2 holes were made in the center of the femoral condyle with the linked intramedullary rod.  After this was done, then the 0 spigot was placed in the femoral hole and this first reaming was carried out.  At this point, an extra-small femoral trial and a size B tibial trial was used with the #3 size expand and 90 degrees of flexion, this gave excellent balancing, but in full extension there was no extension gap and thus a size 3 spigot was used to ream the distal femur.  After this  was done, trials were then again placed.  There was still slight tightness for the #3 and extension, but not in flexion and thus #4 spigot was used to ream the distal femur and then this gave excellent balancing.  After this was done, then the tibial keel cut was made with the tooth brush saw.  After this was done, then the anti-impingement guide was placed on the distal femur and these reamings were carried out.  At this point, then the size B tibial trial was placed, and with the extra-small tibial trial and a #3 polyethylene tibial spacer, the knee was brought through full range of motion and found to be stable with excellent excursion of the tibial spacer, excellent correction of her flexion and varus  deformities.  At this point, it was felt that all trial components were of excellent size, fit, and stability.  They were then removed.  The wound was irrigated with 3 L of saline and the proximal tibia was then exposed and the #B tibial guide with Zinacef impregnated cement backing was hammered into position with an excellent fit with excess cement being removed from around the edges.  The extra- small femoral component with cement backing was hammered in position also with an excellent fit, with excess cement being removed from around the edges.  The #3 polyethylene tibial spacer was placed, and then the knee taken through full range of motion and found to be stable with excellent tracking with excellent stability and excellent correction of her flexion and varus deformities.  At this point, it was felt that all her components were of excellent size, fit, and stability.  The wound was further irrigated with saline and then the median arthrotomy was closed with #1 running Ethibond suture over a Hemovac drain. Subcutaneous tissue was closed with 0 and 2-0 Vicryl, subcuticular layer closed with 4-0 Monocryl.  Sterile dressings were applied and a long-leg splint.  Tourniquet was released and the patient awakened, taken to recovery room in stable condition.  Needle and sponge counts correct x2 at the end of the case.     Nykira Reddix A. Thurston Hole, M.D.     RAW/MEDQ  D:  05/10/2012  T:  05/10/2012  Job:  [id number]

## 2012-05-10 NOTE — Evaluation (Addendum)
Physical Therapy Evaluation Patient Details Name: Stephanie Huang MRN: [id number] DOB: Jun 23, 1965 Today's Date: 05/10/2012 Time: 7829-5621 PT Time Calculation (min): 28 min  PT Assessment / Plan / Recommendation Clinical Impression  Patient is a 47 yo female s/p Lt. TKA.  Will benefit from acute PT to maximize independence prior to discharge to daughter's home.  Recommend HHPT.    PT Assessment  Patient needs continued PT services    Follow Up Recommendations  Home health PT;Supervision/Assistance - 24 hour    Does the patient have the potential to tolerate intense rehabilitation      Barriers to Discharge None      Equipment Recommendations  None recommended by PT    Recommendations for Other Services     Frequency 7X/week    Precautions / Restrictions Precautions Precautions: Knee Precaution Booklet Issued: Yes (comment) Precaution Comments: Reviewed precautions with patient Required Braces or Orthoses: Knee Immobilizer - Left Knee Immobilizer - Left: On except when in CPM;Discontinue once straight leg raise with < 10 degree lag Restrictions Weight Bearing Restrictions: Yes LLE Weight Bearing: Weight bearing as tolerated   Pertinent Vitals/Pain       Mobility  Bed Mobility Bed Mobility: Supine to Sit;Sitting - Scoot to Edge of Bed;Sit to Supine Supine to Sit: 4: Min assist;With rails;HOB elevated Sitting - Scoot to Edge of Bed: 4: Min assist Sit to Supine: 4: Min assist;HOB flat;With rail Details for Bed Mobility Assistance: Verbal cues for technique.  Assist to move LLE off of and onto bed.  Patient able to sit at EOB x 10 minutes with good balance.  Performed ankle pumps in sitting. Transfers Transfers: Not assessed    Exercises Total Joint Exercises Ankle Circles/Pumps: AROM;Both;10 reps;Seated   PT Diagnosis: Difficulty walking;Acute pain  PT Problem List: Decreased strength;Decreased range of motion;Decreased activity tolerance;Decreased  mobility;Decreased knowledge of use of DME;Decreased knowledge of precautions;Obesity;Pain PT Treatment Interventions: DME instruction;Gait training;Functional mobility training;Therapeutic exercise;Patient/family education   PT Goals Acute Rehab PT Goals PT Goal Formulation: With patient/family Time For Goal Achievement: 05/17/12 Potential to Achieve Goals: Good Pt will go Supine/Side to Sit: with supervision;with HOB 0 degrees PT Goal: Supine/Side to Sit - Progress: Goal set today Pt will go Sit to Supine/Side: with supervision;with HOB 0 degrees PT Goal: Sit to Supine/Side - Progress: Goal set today Pt will go Sit to Stand: with supervision;with upper extremity assist PT Goal: Sit to Stand - Progress: Goal set today Pt will go Stand to Sit: with supervision;with upper extremity assist PT Goal: Stand to Sit - Progress: Goal set today Pt will Ambulate: 51 - 150 feet;with supervision;with rolling walker PT Goal: Ambulate - Progress: Goal set today  Visit Information  Last PT Received On: 05/10/12 Assistance Needed: +2    Subjective Data  Subjective: "Why can't I move my leg?"  Explained nerve block to patient. Patient Stated Goal: To walk   Prior Functioning  Home Living Lives With: Spouse (Staying with daughter at discharge) Available Help at Discharge: Family;Available 24 hours/day Type of Home: House Home Access: Level entry Home Layout: One level Bathroom Shower/Tub: Health visitor: Standard Home Adaptive Equipment: Walker - rolling;Straight cane;Bedside commode/3-in-1 Prior Function Level of Independence: Independent with assistive device(s) (using walker/cane pta due to pain) Needs Assistance: Meals - Maximal;  Housekeeping - Total Able to Take Stairs?: Yes (slowly with help) Driving: No Vocation: Unemployed Communication Communication: No difficulties    Cognition  Cognition Overall Cognitive Status: Appears within functional limits for tasks  assessed/performed Arousal/Alertness: Awake/alert Orientation Level: Oriented X4 / Intact Behavior During Session: WFL for tasks performed    Extremity/Trunk Assessment Right Upper Extremity Assessment RUE ROM/Strength/Tone: WFL for tasks assessed RUE Sensation: WFL - Light Touch RUE Coordination: WFL - gross motor Left Upper Extremity Assessment LUE ROM/Strength/Tone: WFL for tasks assessed LUE Sensation: WFL - Light Touch (Patient reports hand/fingers occasionally are numb) LUE Coordination: WFL - gross motor Right Lower Extremity Assessment RLE ROM/Strength/Tone: WFL for tasks assessed RLE Sensation: WFL - Light Touch Left Lower Extremity Assessment LLE ROM/Strength/Tone: Deficits;Unable to fully assess;Due to pain LLE ROM/Strength/Tone Deficits: Patient able to assist moving LLE off of bed LLE Sensation: WFL - Light Touch (Patient reports has numbness occasionally in foot)   Balance Balance Balance Assessed: Yes Static Sitting Balance Static Sitting - Balance Support: No upper extremity supported;Feet supported Static Sitting - Level of Assistance: 5: Stand by assistance Static Sitting - Comment/# of Minutes: 10  End of Session PT - End of Session Equipment Utilized During Treatment: Oxygen;Left knee immobilizer Activity Tolerance: Patient tolerated treatment well Patient left: in bed;in CPM;with call bell/phone within reach;with family/visitor present Nurse Communication: Mobility status CPM Left Knee CPM Left Knee: On Left Knee Flexion (Degrees): 60 Left Knee Extension (Degrees): 0   GP     Vena Austria 05/10/2012, 4:00 PM Durenda Hurt. Renaldo Fiddler, [facility] Acute Rehab Services Pager [phone]

## 2012-05-10 NOTE — Anesthesia Postprocedure Evaluation (Signed)
Anesthesia Post Note  Patient: Stephanie Huang  Procedure(s) Performed: Procedure(s) (LRB): UNICOMPARTMENTAL KNEE (Left)  Anesthesia type: general  Patient location: PACU  Post pain: Pain level controlled  Post assessment: Patient's Cardiovascular Status Stable  Last Vitals:  Filed Vitals:   05/10/12 1100  BP:   Pulse: 96  Temp:   Resp: 13    Post vital signs: Reviewed and stable  Level of consciousness: sedated  Complications: No apparent anesthesia complications

## 2012-05-10 NOTE — Anesthesia Preprocedure Evaluation (Addendum)
Anesthesia Evaluation  Patient identified by MRN, date of birth, ID band Patient awake    Reviewed: Allergy & Precautions, H&P , NPO status , Patient's Chart, lab work & pertinent test results  History of Anesthesia Complications Negative for: history of anesthetic complications  Airway Mallampati: II TM Distance: >3 FB Neck ROM: full    Dental  (+) Teeth Intact and Dental Advidsory Given   Pulmonary neg pulmonary ROS,    Pulmonary exam normal       Cardiovascular hypertension, Pt. on home beta blockers     Neuro/Psych  Headaches, PSYCHIATRIC DISORDERS Anxiety Depression  Neuromuscular disease    GI/Hepatic Neg liver ROS, GERD-  Medicated,  Endo/Other  negative endocrine ROSMorbid obesity  Renal/GU negative Renal ROS     Musculoskeletal  (+) Fibromyalgia -, narcotic dependent  Abdominal   Peds  Hematology   Anesthesia Other Findings   Reproductive/Obstetrics                         Anesthesia Physical Anesthesia Plan  ASA: III  Anesthesia Plan: General   Post-op Pain Management:    Induction:   Airway Management Planned: LMA and Oral ETT  Additional Equipment:   Intra-op Plan:   Post-operative Plan: Extubation in OR  Informed Consent: I have reviewed the patients History and Physical, chart, labs and discussed the procedure including the risks, benefits and alternatives for the proposed anesthesia with the patient or authorized representative who has indicated his/her understanding and acceptance.   Dental advisory given  Plan Discussed with: CRNA, Anesthesiologist and Surgeon  Anesthesia Plan Comments:        Anesthesia Quick Evaluation

## 2012-05-10 NOTE — H&P (View-Only) (Signed)
Stephanie Huang is an 47 y.o. female.   Chief Complaint: left knee pain HPI: Stephanie Huang is a 63 year-old seen for follow up evaluation from her persistent significant left knee pain.  She underwent left knee MRI 2 weeks ago that showed significant increased medial compartment DJD with persistent irregularity in the medial meniscus, but no significant damage in the patellofemoral joint or the lateral compartment.  This confirmed what we had found at the time of her knee arthroscopy, which was done in July of 2013.  She is having pain on a daily basis.  She is using a knee brace and a cane.  Taking Mobic and Norco.  Past Medical History  Diagnosis Date  . Hypertension   . Depression   . Fibromyalgia syndrome   . GERD (gastroesophageal reflux disease)   . Left knee DJD     Past Surgical History  Procedure Date  . Vaginal hysterectomy   . Cesarean section   . Tubal ligation     No family history on file. Social History:  does not have a smoking history on file. She does not have any smokeless tobacco history on file. Her alcohol and drug histories not on file.  Allergies:  Allergies  Allergen Reactions  . Lyrica (Pregabalin) Nausea And Vomiting   Current Outpatient Prescriptions on File Prior to Visit  Medication Sig Dispense Refill  . citalopram (CELEXA) 20 MG tablet Take 20 mg by mouth daily.      . cyclobenzaprine (FLEXERIL) 10 MG tablet Take 10 mg by mouth 3 (three) times daily as needed. For muscle spasms      . HYDROcodone-acetaminophen (NORCO/VICODIN) 5-325 MG per tablet Take 1 tablet by mouth every 6 (six) hours as needed. For pain      . lisinopril-hydrochlorothiazide (PRINZIDE,ZESTORETIC) 20-12.5 MG per tablet Take 1 tablet by mouth daily.      . meloxicam (MOBIC) 15 MG tablet Take 15 mg by mouth daily.      . metoprolol succinate (TOPROL-XL) 50 MG 24 hr tablet Take 50 mg by mouth daily. Take with or immediately following a meal.      . norethindrone (AYGESTIN) 5 MG  tablet Take 5 mg by mouth daily.      Marland Kitchen omeprazole (PRILOSEC) 20 MG capsule Take 20 mg by mouth daily.      . Potassium (POTASSIMIN PO) Take 1 tablet by mouth daily.      Marland Kitchen topiramate (TOPAMAX) 100 MG tablet Take 200 mg by mouth daily.      Marland Kitchen zolpidem (AMBIEN) 10 MG tablet Take 10 mg by mouth at bedtime as needed.        (Not in a hospital admission)  No results found for this or any previous visit (from the past 48 hour(s)). No results found.  Review of Systems  Constitutional: Negative.   HENT: Negative.   Eyes: Negative.   Respiratory: Negative.   Cardiovascular: Negative.   Gastrointestinal: Negative.   Genitourinary: Negative.   Musculoskeletal: Positive for back pain and joint pain.       Low back and left knee pain  Skin: Negative.   Neurological: Negative.   Endo/Heme/Allergies: Negative.   Psychiatric/Behavioral: Negative.     Blood pressure 107/70, pulse 92, temperature 97.5 F (36.4 C), height 5\' 5"  (1.651 m), weight 120.203 kg (265 lb), SpO2 98.00%. Physical Exam  Constitutional: She is oriented to person, place, and time. She appears well-developed and well-nourished.  HENT:  Head: Normocephalic and atraumatic.  Eyes:  Conjunctivae normal and EOM are normal. Pupils are equal, round, and reactive to light.  Neck: Neck supple.  Cardiovascular: Normal rate and regular rhythm.   Respiratory: Effort normal.  GI: Soft.  Musculoskeletal:       Alert and oriented.  Examination of her left knee reveals pain medially.  1+ effusion.  Range of motion 0-100 degrees.  Knee is stable with normal patella tracking.  Examination of the right knee reveals full range of motion without pain, swelling, weakness or instability.  Vascular exam: Pulses are 2+ and symmetric.    Neurological: She is alert and oriented to person, place, and time. She has normal reflexes.  Skin: Skin is warm and dry.  Psychiatric: She has a normal mood and affect. Her behavior is normal.      Assessment Patient Active Problem List  Diagnosis  . Hypertension  . Depression  . Fibromyalgia syndrome  . GERD (gastroesophageal reflux disease)  . Left knee DJD      Plan I have talked to her about this in detail.  I do think with these findings, her significant pain and lack of response to conservative care that she would be an excellent candidate for a unicompartmental arthroplasty.  Risks, complications and benefits of the surgery have been described to her in detail and she understands this completely.  We are awaiting clearance from Dr Donzetta Sprung in [place].  She will go home with home health after surgery.   Stephanie Huang J 04/30/2012, 9:36 AM

## 2012-05-10 NOTE — Progress Notes (Signed)
Pt. Stated Friday 05/07/12 she went to [facility] to give urine speciman. No results or order in epic from [facility]. Obtained urine culture and sent to main lab this am.

## 2012-05-11 DIAGNOSIS — K589 Irritable bowel syndrome without diarrhea: Secondary | ICD-10-CM | POA: Insufficient documentation

## 2012-05-11 DIAGNOSIS — R51 Headache: Secondary | ICD-10-CM | POA: Insufficient documentation

## 2012-05-11 DIAGNOSIS — R519 Headache, unspecified: Secondary | ICD-10-CM | POA: Insufficient documentation

## 2012-05-11 DIAGNOSIS — F419 Anxiety disorder, unspecified: Secondary | ICD-10-CM | POA: Insufficient documentation

## 2012-05-11 LAB — CBC
HCT: 39.9 % (ref 36.0–46.0)
MCV: 91.1 fL (ref 78.0–100.0)
RBC: 4.38 MIL/uL (ref 3.87–5.11)
RDW: 13 % (ref 11.5–15.5)
WBC: 18.1 10*3/uL — ABNORMAL HIGH (ref 4.0–10.5)

## 2012-05-11 LAB — BASIC METABOLIC PANEL
BUN: 6 mg/dL (ref 6–23)
CO2: 22 mEq/L (ref 19–32)
Chloride: 107 mEq/L (ref 96–112)
GFR calc Af Amer: >90 mL/min (ref >90)
Potassium: 4.3 mEq/L (ref 3.5–5.1)

## 2012-05-11 MED ORDER — OXYCODONE HCL ER 10 MG PO T12A
20.0000 mg | EXTENDED_RELEASE_TABLET | Freq: Two times a day (BID) | ORAL | Status: DC
  Administered 2012-05-11: 20 mg via ORAL
  Filled 2012-05-11: qty 2

## 2012-05-11 MED ORDER — ENOXAPARIN SODIUM 30 MG/0.3ML ~~LOC~~ SOLN: 30.0000 mg | Freq: Two times a day (BID) | SUBCUTANEOUS | Status: DC

## 2012-05-11 MED ORDER — BISACODYL 5 MG PO TBEC: DELAYED_RELEASE_TABLET | ORAL | Status: AC

## 2012-05-11 MED ORDER — DSS 100 MG PO CAPS: ORAL_CAPSULE | ORAL | Status: AC

## 2012-05-11 MED ORDER — CELECOXIB 200 MG PO CAPS: 200.0000 mg | ORAL_CAPSULE | Freq: Every day | ORAL | Status: DC

## 2012-05-11 MED ORDER — OXYCODONE HCL ER 20 MG PO T12A: 20.0000 mg | EXTENDED_RELEASE_TABLET | Freq: Two times a day (BID) | ORAL | Status: DC

## 2012-05-11 MED ORDER — OXYCODONE HCL 5 MG PO TABS: ORAL_TABLET | ORAL | Status: DC

## 2012-05-11 MED ORDER — ACETAMINOPHEN 325 MG PO TABS: 650.0000 mg | ORAL_TABLET | Freq: Four times a day (QID) | ORAL | Status: DC | PRN

## 2012-05-11 NOTE — Evaluation (Signed)
Occupational Therapy Evaluation Patient Details Name: Stephanie Huang MRN: [id number] DOB: 1965-04-13 Today's Date: 05/11/2012 Time: 4098-1191 OT Time Calculation (min): 19 min  OT Assessment / Plan / Recommendation Clinical Impression  Pt demos declinein function with LB ADLs follwing L knee surgery. Pt is at mod A level with LB ADLs, sup/set up with UB ADLs and sup with ADL mobility and no further acute OT services needed at this time. All education completed    OT Assessment  Patient does not need any further OT services    Follow Up Recommendations  Home health OT    Barriers to Discharge  None    Equipment Recommendations  Tub/shower bench    Recommendations for Other Services    Frequency       Precautions / Restrictions Precautions Precautions: Knee Required Braces or Orthoses: Knee Immobilizer - Left Knee Immobilizer - Left: On except when in CPM Restrictions Weight Bearing Restrictions: Yes LLE Weight Bearing: Weight bearing as tolerated   Pertinent Vitals/Pain     ADL  Grooming: Performed;Supervision/safety;Set up Where Assessed - Grooming: Supported standing Upper Body Bathing: Simulated;Supervision/safety;Set up Lower Body Bathing: Moderate assistance Upper Body Dressing: Performed;Supervision/safety;Set up Lower Body Dressing: Performed;Moderate assistance Toilet Transfer: Research scientist (life sciences) Method: Sit to Barista: Raised toilet seat with arms (or 3-in-1 over toilet) Toileting - Clothing Manipulation and Hygiene: Performed;Min guard Where Assessed - Glass blower/designer Manipulation and Hygiene: Standing Equipment Used: Sock aid;Gait belt;Knee Immobilizer;Rolling walker;Reacher;Long-handled sponge;Long-handled shoe horn;Other (comment) (3 in 1) ADL Comments: pt and family provided with education and demo of ADL A/E, pt able to return demo    OT Diagnosis:    OT Problem List:   OT Treatment  Interventions:     OT Goals    Visit Information  Last OT Received On: 05/11/12 Assistance Needed: +1    Subjective Data  Subjective: " My 2 daughters are nurses and they can help me at home " Patient Stated Goal: To return home   Prior Functioning     Home Living Lives With: Spouse Available Help at Discharge: Family;Available 24 hours/day Type of Home: House Home Access: Level entry Home Layout: One level Bathroom Shower/Tub: Health visitor: Standard Home Adaptive Equipment: Walker - rolling;Straight cane;Bedside commode/3-in-1 Prior Function Level of Independence: Independent with assistive device(s);Needs assistance Needs Assistance: Meal Prep;Light Housekeeping Meal Prep: Maximal Light Housekeeping: Total Able to Take Stairs?: Yes Driving: No Vocation: Unemployed Communication Communication: No difficulties Dominant Hand: Right         Vision/Perception Vision - History Baseline Vision: Wears glasses only for reading Patient Visual Report: No change from baseline Vision - Assessment Eye Alignment: Within Functional Limits Perception Perception: Within Functional Limits   Cognition  Cognition Overall Cognitive Status: Appears within functional limits for tasks assessed/performed Arousal/Alertness: Awake/alert Orientation Level: Appears intact for tasks assessed Behavior During Session: [facility] for tasks performed    Extremity/Trunk Assessment Right Upper Extremity Assessment RUE ROM/Strength/Tone: [facility] for tasks assessed Left Upper Extremity Assessment LUE ROM/Strength/Tone: WFL for tasks assessed     Mobility Bed Mobility Bed Mobility: Not assessed Transfers Transfers: Sit to Stand;Stand to Sit Sit to Stand: 5: Supervision Stand to Sit: 5: Supervision Details for Transfer Assistance: No physical assistance required. Pt demonstrate safe and proper techniques.     Exercise   Balance Balance Balance Assessed: No   End of  Session OT - End of Session Equipment Utilized During Treatment: Gait belt;Left knee immobilizer;Other (comment) (3 in 1, RW,  ADL A/E) Activity Tolerance: Patient tolerated treatment well Patient left: in chair;with call bell/phone within reach;with chair alarm set  GO     Galen Manila 05/11/2012, 1:16 PM

## 2012-05-11 NOTE — Addendum Note (Signed)
Addendum created 05/11/12 1140 by Melina Schools, CRNA   Modules edited: Anesthesia Medication Administration

## 2012-05-11 NOTE — Progress Notes (Signed)
D/C instructions and scripts given to Pt. Lovenox teaching done with Pt and pts daughter who is a Charity fundraiser and plans to admin injections. Pt had no questions regarding D/C instructions.

## 2012-05-11 NOTE — Progress Notes (Signed)
Physical Therapy Treatment Patient Details Name: Stephanie Huang MRN: [id number] DOB: 1965/09/29 Today's Date: 05/11/2012 Time: 4098-1191 PT Time Calculation (min): 29 min  PT Assessment / Plan / Recommendation Comments on Treatment Session  Pt making good progress with mobility. Pt has prior experience with therapy therefore has good technique with mobility. Initiated long hall ambulation and seated exercises. Exercise handout provided. Pt encouraged to continue exericses upon D/C to increase quad strength.      Follow Up Recommendations  Home health PT;Supervision/Assistance - 24 hour                 Equipment Recommendations  None recommended by PT    Recommendations for Other Services    Frequency 7X/week   Plan Discharge plan remains appropriate;Frequency remains appropriate    Precautions / Restrictions Precautions Precautions: Knee Required Braces or Orthoses: Knee Immobilizer - Left Knee Immobilizer - Left: On except when in CPM Restrictions Weight Bearing Restrictions: Yes LLE Weight Bearing: Weight bearing as tolerated   no apparent distress       Mobility  Transfers Transfers: Sit to Stand;Stand to Sit Sit to Stand: 5: Supervision;With upper extremity assist;From chair/3-in-1 Stand to Sit: 5: Supervision;With armrests;To chair/3-in-1 Details for Transfer Assistance: No physical assistance required. Pt demonstrate safe and proper techniques. Ambulation/Gait Ambulation/Gait Assistance: 4: Min guard Ambulation Distance (Feet): 220 Feet Assistive device: Rolling walker Ambulation/Gait Assistance Details: Cueing for heel strike, Increasing WBing through LLE and step through gait pattern Gait Pattern: Step-to pattern;Step-through pattern;Decreased step length - right;Decreased stance time - left General Gait Details: Attempted to progress to step through pattern, pt not able to maintain  at this time    Exercises Total Joint Exercises Ankle Circles/Pumps:  AROM;Both;10 reps Quad Sets: AROM;Both;10 reps Towel Squeeze: AROM;Both;10 reps Heel Slides: AAROM;Left;10 reps Hip ABduction/ADduction: AROM;Left;10 reps Straight Leg Raises: Limitations Straight Leg Raises Limitations: Pt unable to complete at this time Knee Flexion: AAROM;Left;5 reps;Seated    PT Goals Acute Rehab PT Goals PT Goal: Sit to Stand - Progress: Met PT Goal: Stand to Sit - Progress: Met PT Goal: Ambulate - Progress: Progressing toward goal  Visit Information  Last PT Received On: 05/11/12 Assistance Needed: +1    Subjective Data      Cognition  Cognition Overall Cognitive Status: Appears within functional limits for tasks assessed/performed Arousal/Alertness: Awake/alert Orientation Level: Appears intact for tasks assessed Behavior During Session: [facility] for tasks performed    Balance     End of Session PT - End of Session Equipment Utilized During Treatment: Gait belt;Left knee immobilizer Activity Tolerance: Patient tolerated treatment well Patient left: in chair;with call bell/phone within reach;with family/visitor present Nurse Communication: Mobility status   GP     Stephanie Huang 05/11/2012, 9:59 AM

## 2012-05-11 NOTE — Progress Notes (Signed)
CARE MANAGEMENT NOTE 05/11/2012  Patient:  Stephanie Huang, Stephanie Huang   Account Number:  [id number]  Date Initiated:  05/11/2012  Documentation initiated by:  Vance Peper  Subjective/Objective Assessment:   47 yr old female s/p left total knee arthroplasty.     Action/Plan:   CM spoke with patient concerning home health and DME needs at discharge.Choice offered.   Anticipated DC Date:  05/11/2012   Anticipated DC Plan:  HOME W HOME HEALTH SERVICES      DC Planning Services  CM consult      PAC Choice  DURABLE MEDICAL EQUIPMENT  HOME HEALTH   Choice offered to / List presented to:  C-1 Patient   DME arranged  3-N-1  CPM      DME agency  [employer]     HH arranged  HH-2 PT      HH agency  [facility]   Status of service:  Completed, signed off Medicare Important Message given?   (If response is "NO", the following Medicare IM given date fields will be blank) Date Medicare IM given:   Date Additional Medicare IM given:    Discharge Disposition:  HOME W HOME HEALTH SERVICES  Per UR Regulation:    If discussed at Long Length of Stay Meetings, dates discussed:    Comments:  05/11/12 10:41AM Vance Peper, RN BSN case Manager Patient will be going to her daughters home:  [address], [place], [place] [address]  [phone]

## 2012-05-11 NOTE — Progress Notes (Signed)
05/11/2012 Fredrich Birks PTA [phone] pager [phone] office

## 2012-05-11 NOTE — Discharge Summary (Signed)
Patient ID: Stephanie Huang MRN: [id number] DOB/AGE: 1966/01/11 47 y.o.  Admit date: 05/10/2012 Discharge date: 05/11/2012  Admission Diagnoses:  Principal Problem:   Left knee DJD Active Problems:   Hypertension   Depression   Fibromyalgia syndrome   GERD (gastroesophageal reflux disease)   Discharge Diagnoses:  Same  Past Medical History  Diagnosis Date  . Hypertension   . Depression   . Fibromyalgia syndrome   . GERD (gastroesophageal reflux disease)   . Left knee DJD   . Fibromyalgia   . Anxiety   . Headache     Mirgraine- January 2014- last one  . IBS (irritable bowel syndrome)   . History of blood transfusion     after C- Section    Surgeries: Procedure(s): UNICOMPARTMENTAL KNEE on 05/10/2012   Consultants:    Discharged Condition: Improved  Hospital Course: Stephanie Huang is an 47 y.o. female who was admitted 05/10/2012 for operative treatment ofLeft knee DJD. Patient has severe unremitting pain that affects sleep, daily activities, and work/hobbies. After pre-op clearance the patient was taken to the operating room on 05/10/2012 and underwent  Procedure(s): UNICOMPARTMENTAL KNEE.    Patient was given perioperative antibiotics:     Anti-infectives   Start     Dose/Rate Route Frequency Ordered Stop   05/10/12 1400  ceFAZolin (ANCEF) IVPB 2 g/50 mL premix     2 g 100 mL/hr over 30 Minutes Intravenous Every 6 hours 05/10/12 1131 05/10/12 2247   05/10/12 0900  cefUROXime (ZINACEF) injection  Status:  Discontinued       As needed 05/10/12 0900 05/10/12 0919   05/10/12 0600  ceFAZolin (ANCEF) 3 g in dextrose 5 % 50 mL IVPB     3 g 160 mL/hr over 30 Minutes Intravenous On call to O.R. 05/09/12 1317 05/10/12 0730       Patient was given sequential compression devices, early ambulation, and chemoprophylaxis to prevent DVT.  Patient benefited maximally from hospital stay and there were no complications.    Recent vital signs:  Patient Vitals for the  past 24 hrs:  BP Temp Temp src Pulse Resp SpO2  05/11/12 0648 112/56 mmHg 98.7 F (37.1 C) Oral 70 18 98 %  05/11/12 0400 - - - - 18 99 %  05/11/12 0000 - - - - 17 99 %  05/10/12 2311 105/53 mmHg 98.3 F (36.8 C) Oral 66 18 98 %  05/10/12 2000 - - - - 18 98 %  05/10/12 1838 120/62 mmHg 97.7 F (36.5 C) Oral 97 20 95 %  05/10/12 1658 - - - - 18 96 %  05/10/12 1200 - - - - 18 93 %  05/10/12 1146 142/88 mmHg 98.3 F (36.8 C) Axillary - 18 100 %  05/10/12 1115 112/74 mmHg - - 94 14 100 %  05/10/12 1100 - - - 96 13 100 %  05/10/12 1045 127/78 mmHg - - 103 17 100 %  05/10/12 1030 136/95 mmHg - - 102 10 100 %  05/10/12 1015 117/87 mmHg - - 103 12 95 %  05/10/12 1000 105/73 mmHg - - 106 13 99 %  05/10/12 0945 130/84 mmHg - - 105 14 100 %  05/10/12 0931 143/82 mmHg 98.2 F (36.8 C) - 104 11 100 %     Recent laboratory studies:   Recent Labs  05/10/12 1230 05/11/12 0452  WBC 17.3* 18.1*  HGB 15.1* 13.4  HCT 44.3 39.9  PLT 242 276  NA  --  139  K  --  4.3  CL  --  107  CO2  --  22  BUN  --  6  CREATININE 0.55 0.50  GLUCOSE  --  199*  CALCIUM  --  8.9     Discharge Medications:     Medication List    STOP taking these medications       HYDROcodone-acetaminophen 5-325 MG per tablet  Commonly known as:  NORCO/VICODIN     meloxicam 15 MG tablet  Commonly known as:  MOBIC     norethindrone 5 MG tablet  Commonly known as:  AYGESTIN      TAKE these medications       acetaminophen 325 MG tablet  Commonly known as:  TYLENOL  Take 2 tablets (650 mg total) by mouth every 6 (six) hours as needed for pain or fever.     bisacodyl 5 MG EC tablet  Commonly known as:  DULCOLAX  Take 2 tablets every night with dinner until bowel movement.  LAXITIVE.  Restart if two days since last bowel movement     celecoxib 200 MG capsule  Commonly known as:  CELEBREX  Take 1 capsule (200 mg total) by mouth daily.     citalopram 20 MG tablet  Commonly known as:  CELEXA  Take 20 mg  by mouth daily.     cyclobenzaprine 10 MG tablet  Commonly known as:  FLEXERIL  Take 10 mg by mouth 3 (three) times daily as needed. For muscle spasms     DSS 100 MG Caps  1 tab 2 times a day while on narcotics.  STOOL SOFTENER     enoxaparin 30 MG/0.3ML injection  Commonly known as:  LOVENOX  Inject 0.3 mLs (30 mg total) into the skin every 12 (twelve) hours.     lisinopril-hydrochlorothiazide 20-12.5 MG per tablet  Commonly known as:  PRINZIDE,ZESTORETIC  Take 1 tablet by mouth daily.     metoprolol succinate 50 MG 24 hr tablet  Commonly known as:  TOPROL-XL  Take 50 mg by mouth daily. Take with or immediately following a meal.     omeprazole 20 MG capsule  Commonly known as:  PRILOSEC  Take 20 mg by mouth daily.     oxyCODONE 5 MG immediate release tablet  Commonly known as:  Oxy IR/ROXICODONE  1-2 tablets every 4-6 hrs as needed for pain     OxyCODONE 20 mg T12a  Commonly known as:  OXYCONTIN  Take 1 tablet (20 mg total) by mouth every 12 (twelve) hours.     POTASSIMIN PO  Take 1 tablet by mouth daily.     topiramate 100 MG tablet  Commonly known as:  TOPAMAX  Take 200 mg by mouth daily.     zolpidem 10 MG tablet  Commonly known as:  AMBIEN  Take 10 mg by mouth at bedtime as needed.        Diagnostic Studies: Dg Chest 2 View  05/03/2012  *RADIOLOGY REPORT*  Clinical Data: Preop for left knee arthroplasty  CHEST - 2 VIEW  Comparison: None.  Findings: Borderline cardiomegaly.  No acute infiltrate or pleural effusion.  No pulmonary edema.  Mild basilar atelectasis.  Mild degenerative changes mid thoracic spine.  IMPRESSION: No acute infiltrate or pulmonary edema.  Mild basilar atelectasis. Borderline cardiomegaly.   Original Report Authenticated By: Natasha Mead, M.D.     Disposition: Final discharge disposition not confirmed  Discharge Orders   Future Orders Complete By Expires  CPM  As directed     Comments:      Continuous passive motion machine (CPM):       Use the CPM from 0 to 90 for 6 hours per day.       You may break it up into 2 or 3 sessions per day.      Use CPM for 2 weeks or until you are told to stop.    Call MD / Call 911  As directed     Comments:      If you experience chest pain or shortness of breath, CALL 911 and be transported to the hospital emergency room.  If you develope a fever above 101 F, pus (white drainage) or increased drainage or redness at the wound, or calf pain, call your surgeon's office.    Change dressing  As directed     Comments:      Change the dressing daily with sterile 4 x 4 inch gauze dressing and apply TED hose.  You may clean the incision with alcohol prior to redressing.    Constipation Prevention  As directed     Comments:      Drink plenty of fluids.  Prune juice may be helpful.  You may use a stool softener, such as Colace (over the counter) 100 mg twice a day.  Use MiraLax (over the counter) for constipation as needed.    Diet - low sodium heart healthy  As directed     Discharge instructions  As directed     Comments:      Partial Knee Replacement Care After Refer to this sheet in the next few weeks. These discharge instructions provide you with general information on caring for yourself after you leave the hospital. Your caregiver may also give you specific instructions. Your treatment has been planned according to the most current medical practices available, but unavoidable complications sometimes occur. If you have any problems or questions after discharge, please call your caregiver. Regaining a near full range of motion of your knee within the first 3 to 6 weeks after surgery is critical. HOME CARE INSTRUCTIONS  You may resume a normal diet and activities as directed.  Perform exercises as directed.  Place yellow foam block, yellow side up under heel at all times except when in CPM or when walking.  DO NOT modify, tear, cut, or change in any way. You will receive physical therapy daily   Take showers instead of baths until informed otherwise.  Change bandages (dressings)daily Do not take over-the-counter or prescription medicines for pain, discomfort, or fever. Eat a well-balanced diet.  Avoid lifting or driving until you are instructed otherwise.  Make an appointment to see your caregiver for stitches (suture) or staple removal as directed.  If you have been sent home with a continuous passive motion machine (CPM machine), 0-90 degrees 6 hrs a day   2 hrs a shift SEEK MEDICAL CARE IF: You have swelling of your calf or leg.  You develop shortness of breath or chest pain.  You have redness, swelling, or increasing pain in the wound.  There is pus or any unusual drainage coming from the surgical site.  You notice a bad smell coming from the surgical site or dressing.  The surgical site breaks open after sutures or staples have been removed.  There is persistent bleeding from the suture or staple line.  You are getting worse or are not improving.  You have any other questions or concerns.  SEEK IMMEDIATE MEDICAL CARE IF:  You have a fever.  You develop a rash.  You have difficulty breathing.  You develop any reaction or side effects to medicines given.  Your knee motion is decreasing rather than improving.  MAKE SURE YOU:  Understand these instructions.  Will watch your condition.  Will get help right away if you are not doing well or get worse.    Do not put a pillow under the knee. Place it under the heel.  As directed     Comments:      Place yellow foam block, yellow side up under heel at all times except when in CPM or when walking.  DO NOT modify, tear, cut, or change in any way the yellow foam block.    Increase activity slowly as tolerated  As directed     TED hose  As directed     Comments:      Use stockings (TED hose) for 2 weeks on both leg(s).  You may remove them at night for sleeping.       Follow-up Information   Follow up with Nilda Simmer, MD  On 05/18/2012. (appt time 3:20)    Contact information:   [address] [place] [address] [phone]        Signed: Pascal Lux 05/11/2012, 7:18 AM

## 2012-05-12 LAB — URINE CULTURE

## 2012-05-13 ENCOUNTER — Encounter (HOSPITAL_COMMUNITY): Payer: Self-pay | Admitting: Orthopedic Surgery

## 2016-11-07 DIAGNOSIS — G894 Chronic pain syndrome: Secondary | ICD-10-CM | POA: Insufficient documentation

## 2016-11-07 DIAGNOSIS — M170 Bilateral primary osteoarthritis of knee: Secondary | ICD-10-CM | POA: Insufficient documentation

## 2016-11-07 DIAGNOSIS — M47816 Spondylosis without myelopathy or radiculopathy, lumbar region: Secondary | ICD-10-CM | POA: Insufficient documentation

## 2017-03-25 DIAGNOSIS — D126 Benign neoplasm of colon, unspecified: Secondary | ICD-10-CM | POA: Insufficient documentation

## 2017-12-24 DIAGNOSIS — Z23 Encounter for immunization: Secondary | ICD-10-CM | POA: Diagnosis not present

## 2017-12-24 DIAGNOSIS — Z6841 Body Mass Index (BMI) 40.0 and over, adult: Secondary | ICD-10-CM | POA: Diagnosis not present

## 2017-12-24 DIAGNOSIS — Z1389 Encounter for screening for other disorder: Secondary | ICD-10-CM | POA: Diagnosis not present

## 2017-12-24 DIAGNOSIS — E782 Mixed hyperlipidemia: Secondary | ICD-10-CM | POA: Diagnosis not present

## 2017-12-24 DIAGNOSIS — Z79891 Long term (current) use of opiate analgesic: Secondary | ICD-10-CM | POA: Diagnosis not present

## 2017-12-24 DIAGNOSIS — I10 Essential (primary) hypertension: Secondary | ICD-10-CM | POA: Diagnosis not present

## 2017-12-24 DIAGNOSIS — E1169 Type 2 diabetes mellitus with other specified complication: Secondary | ICD-10-CM | POA: Diagnosis not present

## 2018-01-18 DIAGNOSIS — M069 Rheumatoid arthritis, unspecified: Secondary | ICD-10-CM | POA: Diagnosis not present

## 2018-01-18 DIAGNOSIS — L438 Other lichen planus: Secondary | ICD-10-CM | POA: Insufficient documentation

## 2018-01-18 DIAGNOSIS — Z79899 Other long term (current) drug therapy: Secondary | ICD-10-CM | POA: Diagnosis not present

## 2018-01-18 DIAGNOSIS — M0579 Rheumatoid arthritis with rheumatoid factor of multiple sites without organ or systems involvement: Secondary | ICD-10-CM | POA: Insufficient documentation

## 2018-01-18 DIAGNOSIS — Z5181 Encounter for therapeutic drug level monitoring: Secondary | ICD-10-CM | POA: Diagnosis not present

## 2018-01-18 DIAGNOSIS — L439 Lichen planus, unspecified: Secondary | ICD-10-CM | POA: Diagnosis not present

## 2018-01-18 DIAGNOSIS — L409 Psoriasis, unspecified: Secondary | ICD-10-CM | POA: Insufficient documentation

## 2018-01-20 DIAGNOSIS — Z6841 Body Mass Index (BMI) 40.0 and over, adult: Secondary | ICD-10-CM | POA: Diagnosis not present

## 2018-01-20 DIAGNOSIS — M797 Fibromyalgia: Secondary | ICD-10-CM | POA: Diagnosis not present

## 2018-01-20 DIAGNOSIS — M2550 Pain in unspecified joint: Secondary | ICD-10-CM | POA: Diagnosis not present

## 2018-01-20 DIAGNOSIS — M0579 Rheumatoid arthritis with rheumatoid factor of multiple sites without organ or systems involvement: Secondary | ICD-10-CM | POA: Diagnosis not present

## 2018-01-20 DIAGNOSIS — L439 Lichen planus, unspecified: Secondary | ICD-10-CM | POA: Diagnosis not present

## 2018-01-20 DIAGNOSIS — G8929 Other chronic pain: Secondary | ICD-10-CM | POA: Diagnosis not present

## 2018-03-03 DIAGNOSIS — Z79899 Other long term (current) drug therapy: Secondary | ICD-10-CM | POA: Diagnosis not present

## 2018-03-03 DIAGNOSIS — L439 Lichen planus, unspecified: Secondary | ICD-10-CM | POA: Diagnosis not present

## 2018-03-03 DIAGNOSIS — Z5181 Encounter for therapeutic drug level monitoring: Secondary | ICD-10-CM | POA: Diagnosis not present

## 2018-04-09 DIAGNOSIS — E782 Mixed hyperlipidemia: Secondary | ICD-10-CM | POA: Diagnosis not present

## 2018-04-09 DIAGNOSIS — F331 Major depressive disorder, recurrent, moderate: Secondary | ICD-10-CM | POA: Diagnosis not present

## 2018-04-09 DIAGNOSIS — E8881 Metabolic syndrome: Secondary | ICD-10-CM | POA: Diagnosis not present

## 2018-04-09 DIAGNOSIS — G252 Other specified forms of tremor: Secondary | ICD-10-CM | POA: Diagnosis not present

## 2018-04-09 DIAGNOSIS — Z6841 Body Mass Index (BMI) 40.0 and over, adult: Secondary | ICD-10-CM | POA: Diagnosis not present

## 2018-04-09 DIAGNOSIS — I10 Essential (primary) hypertension: Secondary | ICD-10-CM | POA: Diagnosis not present

## 2018-04-09 DIAGNOSIS — F17210 Nicotine dependence, cigarettes, uncomplicated: Secondary | ICD-10-CM | POA: Diagnosis not present

## 2018-07-12 DIAGNOSIS — G252 Other specified forms of tremor: Secondary | ICD-10-CM | POA: Diagnosis not present

## 2018-07-12 DIAGNOSIS — E782 Mixed hyperlipidemia: Secondary | ICD-10-CM | POA: Diagnosis not present

## 2018-07-12 DIAGNOSIS — Z23 Encounter for immunization: Secondary | ICD-10-CM | POA: Diagnosis not present

## 2018-07-12 DIAGNOSIS — I739 Peripheral vascular disease, unspecified: Secondary | ICD-10-CM | POA: Diagnosis not present

## 2018-07-12 DIAGNOSIS — I10 Essential (primary) hypertension: Secondary | ICD-10-CM | POA: Diagnosis not present

## 2018-07-12 DIAGNOSIS — Z79891 Long term (current) use of opiate analgesic: Secondary | ICD-10-CM | POA: Diagnosis not present

## 2018-07-12 DIAGNOSIS — F331 Major depressive disorder, recurrent, moderate: Secondary | ICD-10-CM | POA: Diagnosis not present

## 2018-07-12 DIAGNOSIS — Z6841 Body Mass Index (BMI) 40.0 and over, adult: Secondary | ICD-10-CM | POA: Diagnosis not present

## 2018-07-26 DIAGNOSIS — B370 Candidal stomatitis: Secondary | ICD-10-CM | POA: Diagnosis not present

## 2018-07-26 DIAGNOSIS — L409 Psoriasis, unspecified: Secondary | ICD-10-CM | POA: Diagnosis not present

## 2018-07-26 DIAGNOSIS — L439 Lichen planus, unspecified: Secondary | ICD-10-CM | POA: Diagnosis not present

## 2018-07-26 DIAGNOSIS — M069 Rheumatoid arthritis, unspecified: Secondary | ICD-10-CM | POA: Diagnosis not present

## 2018-07-28 DIAGNOSIS — G894 Chronic pain syndrome: Secondary | ICD-10-CM | POA: Diagnosis not present

## 2018-07-28 DIAGNOSIS — M47816 Spondylosis without myelopathy or radiculopathy, lumbar region: Secondary | ICD-10-CM | POA: Diagnosis not present

## 2018-07-28 DIAGNOSIS — Z79891 Long term (current) use of opiate analgesic: Secondary | ICD-10-CM | POA: Diagnosis not present

## 2018-07-28 DIAGNOSIS — M545 Low back pain: Secondary | ICD-10-CM | POA: Diagnosis not present

## 2018-07-28 DIAGNOSIS — M170 Bilateral primary osteoarthritis of knee: Secondary | ICD-10-CM | POA: Diagnosis not present

## 2018-08-21 DIAGNOSIS — E7800 Pure hypercholesterolemia, unspecified: Secondary | ICD-10-CM | POA: Diagnosis not present

## 2018-08-21 DIAGNOSIS — I10 Essential (primary) hypertension: Secondary | ICD-10-CM | POA: Diagnosis not present

## 2018-08-21 DIAGNOSIS — E1140 Type 2 diabetes mellitus with diabetic neuropathy, unspecified: Secondary | ICD-10-CM | POA: Diagnosis not present

## 2018-09-01 DIAGNOSIS — Z1212 Encounter for screening for malignant neoplasm of rectum: Secondary | ICD-10-CM | POA: Diagnosis not present

## 2018-09-01 DIAGNOSIS — F331 Major depressive disorder, recurrent, moderate: Secondary | ICD-10-CM | POA: Diagnosis not present

## 2018-09-01 DIAGNOSIS — I739 Peripheral vascular disease, unspecified: Secondary | ICD-10-CM | POA: Diagnosis not present

## 2018-09-01 DIAGNOSIS — M05711 Rheumatoid arthritis with rheumatoid factor of right shoulder without organ or systems involvement: Secondary | ICD-10-CM | POA: Diagnosis not present

## 2018-09-01 DIAGNOSIS — Z0001 Encounter for general adult medical examination with abnormal findings: Secondary | ICD-10-CM | POA: Diagnosis not present

## 2018-09-01 DIAGNOSIS — G252 Other specified forms of tremor: Secondary | ICD-10-CM | POA: Diagnosis not present

## 2018-09-01 DIAGNOSIS — I10 Essential (primary) hypertension: Secondary | ICD-10-CM | POA: Diagnosis not present

## 2018-09-01 DIAGNOSIS — Z6841 Body Mass Index (BMI) 40.0 and over, adult: Secondary | ICD-10-CM | POA: Diagnosis not present

## 2018-10-22 DIAGNOSIS — E1165 Type 2 diabetes mellitus with hyperglycemia: Secondary | ICD-10-CM | POA: Diagnosis not present

## 2018-10-22 DIAGNOSIS — E7800 Pure hypercholesterolemia, unspecified: Secondary | ICD-10-CM | POA: Diagnosis not present

## 2018-10-22 DIAGNOSIS — I10 Essential (primary) hypertension: Secondary | ICD-10-CM | POA: Diagnosis not present

## 2018-12-02 DIAGNOSIS — Z6841 Body Mass Index (BMI) 40.0 and over, adult: Secondary | ICD-10-CM | POA: Diagnosis not present

## 2018-12-02 DIAGNOSIS — Z79891 Long term (current) use of opiate analgesic: Secondary | ICD-10-CM | POA: Diagnosis not present

## 2018-12-02 DIAGNOSIS — I10 Essential (primary) hypertension: Secondary | ICD-10-CM | POA: Diagnosis not present

## 2018-12-02 DIAGNOSIS — F17210 Nicotine dependence, cigarettes, uncomplicated: Secondary | ICD-10-CM | POA: Diagnosis not present

## 2018-12-02 DIAGNOSIS — F331 Major depressive disorder, recurrent, moderate: Secondary | ICD-10-CM | POA: Diagnosis not present

## 2018-12-02 DIAGNOSIS — Z1331 Encounter for screening for depression: Secondary | ICD-10-CM | POA: Diagnosis not present

## 2018-12-02 DIAGNOSIS — Z23 Encounter for immunization: Secondary | ICD-10-CM | POA: Diagnosis not present

## 2018-12-02 DIAGNOSIS — G252 Other specified forms of tremor: Secondary | ICD-10-CM | POA: Diagnosis not present

## 2018-12-25 DIAGNOSIS — K1121 Acute sialoadenitis: Secondary | ICD-10-CM | POA: Diagnosis not present

## 2018-12-28 DIAGNOSIS — Z1231 Encounter for screening mammogram for malignant neoplasm of breast: Secondary | ICD-10-CM | POA: Diagnosis not present

## 2019-02-01 DIAGNOSIS — Z6841 Body Mass Index (BMI) 40.0 and over, adult: Secondary | ICD-10-CM | POA: Diagnosis not present

## 2019-02-01 DIAGNOSIS — M533 Sacrococcygeal disorders, not elsewhere classified: Secondary | ICD-10-CM | POA: Diagnosis not present

## 2019-02-01 DIAGNOSIS — M7061 Trochanteric bursitis, right hip: Secondary | ICD-10-CM | POA: Diagnosis not present

## 2019-02-01 DIAGNOSIS — M7062 Trochanteric bursitis, left hip: Secondary | ICD-10-CM | POA: Diagnosis not present

## 2019-03-08 DIAGNOSIS — L439 Lichen planus, unspecified: Secondary | ICD-10-CM | POA: Diagnosis not present

## 2019-03-08 DIAGNOSIS — Z5181 Encounter for therapeutic drug level monitoring: Secondary | ICD-10-CM | POA: Diagnosis not present

## 2019-03-08 DIAGNOSIS — L409 Psoriasis, unspecified: Secondary | ICD-10-CM | POA: Diagnosis not present

## 2019-03-09 DIAGNOSIS — M05711 Rheumatoid arthritis with rheumatoid factor of right shoulder without organ or systems involvement: Secondary | ICD-10-CM | POA: Diagnosis not present

## 2019-03-09 DIAGNOSIS — G252 Other specified forms of tremor: Secondary | ICD-10-CM | POA: Diagnosis not present

## 2019-03-09 DIAGNOSIS — Z6841 Body Mass Index (BMI) 40.0 and over, adult: Secondary | ICD-10-CM | POA: Diagnosis not present

## 2019-03-09 DIAGNOSIS — Z79891 Long term (current) use of opiate analgesic: Secondary | ICD-10-CM | POA: Diagnosis not present

## 2019-03-09 DIAGNOSIS — F331 Major depressive disorder, recurrent, moderate: Secondary | ICD-10-CM | POA: Diagnosis not present

## 2019-03-09 DIAGNOSIS — J449 Chronic obstructive pulmonary disease, unspecified: Secondary | ICD-10-CM | POA: Diagnosis not present

## 2019-03-09 DIAGNOSIS — I10 Essential (primary) hypertension: Secondary | ICD-10-CM | POA: Diagnosis not present

## 2019-03-09 DIAGNOSIS — Z23 Encounter for immunization: Secondary | ICD-10-CM | POA: Diagnosis not present

## 2019-06-06 DIAGNOSIS — F331 Major depressive disorder, recurrent, moderate: Secondary | ICD-10-CM | POA: Diagnosis not present

## 2019-06-06 DIAGNOSIS — E8881 Metabolic syndrome: Secondary | ICD-10-CM | POA: Diagnosis not present

## 2019-06-06 DIAGNOSIS — J449 Chronic obstructive pulmonary disease, unspecified: Secondary | ICD-10-CM | POA: Diagnosis not present

## 2019-06-06 DIAGNOSIS — E782 Mixed hyperlipidemia: Secondary | ICD-10-CM | POA: Diagnosis not present

## 2019-06-06 DIAGNOSIS — Z6841 Body Mass Index (BMI) 40.0 and over, adult: Secondary | ICD-10-CM | POA: Diagnosis not present

## 2019-06-06 DIAGNOSIS — I10 Essential (primary) hypertension: Secondary | ICD-10-CM | POA: Diagnosis not present

## 2019-06-06 DIAGNOSIS — R4582 Worries: Secondary | ICD-10-CM | POA: Diagnosis not present

## 2019-06-06 DIAGNOSIS — I739 Peripheral vascular disease, unspecified: Secondary | ICD-10-CM | POA: Diagnosis not present

## 2019-06-14 DIAGNOSIS — M170 Bilateral primary osteoarthritis of knee: Secondary | ICD-10-CM | POA: Diagnosis not present

## 2019-10-05 DIAGNOSIS — L439 Lichen planus, unspecified: Secondary | ICD-10-CM | POA: Diagnosis not present

## 2019-10-05 DIAGNOSIS — Z79899 Other long term (current) drug therapy: Secondary | ICD-10-CM | POA: Diagnosis not present

## 2019-10-05 DIAGNOSIS — L4050 Arthropathic psoriasis, unspecified: Secondary | ICD-10-CM | POA: Diagnosis not present

## 2019-10-05 DIAGNOSIS — L409 Psoriasis, unspecified: Secondary | ICD-10-CM | POA: Diagnosis not present

## 2019-11-17 DIAGNOSIS — Z6841 Body Mass Index (BMI) 40.0 and over, adult: Secondary | ICD-10-CM | POA: Diagnosis not present

## 2019-11-17 DIAGNOSIS — F331 Major depressive disorder, recurrent, moderate: Secondary | ICD-10-CM | POA: Diagnosis not present

## 2020-01-13 ENCOUNTER — Other Ambulatory Visit: Payer: Self-pay

## 2020-01-13 ENCOUNTER — Ambulatory Visit (INDEPENDENT_AMBULATORY_CARE_PROVIDER_SITE_OTHER): Payer: Managed Care, Other (non HMO) | Admitting: Urology

## 2020-01-13 ENCOUNTER — Encounter: Payer: Self-pay | Admitting: Urology

## 2020-01-13 VITALS — BP 113/75 | HR 112 | Temp 98.5°F | Ht 64.0 in | Wt 262.0 lb

## 2020-01-13 DIAGNOSIS — N390 Urinary tract infection, site not specified: Secondary | ICD-10-CM | POA: Diagnosis not present

## 2020-01-13 DIAGNOSIS — R339 Retention of urine, unspecified: Secondary | ICD-10-CM | POA: Diagnosis not present

## 2020-01-13 LAB — BLADDER SCAN AMB NON-IMAGING: Scan Result: 202

## 2020-01-13 MED ORDER — NITROFURANTOIN MACROCRYSTAL 50 MG PO CAPS: 50.0000 mg | ORAL_CAPSULE | Freq: Four times a day (QID) | ORAL | 11 refills | Status: DC

## 2020-01-13 NOTE — Progress Notes (Signed)
PVR=202   Urological Symptom Review  Patient is experiencing the following symptoms: Frequent urination Hard to postpone urination Burning/pain with urination Get up at night to urinate Leakage of urine Stream starts and stops Urinary tract infection   Review of Systems  Gastrointestinal (upper)  : Indigestion/heartburn  Gastrointestinal (lower) : Constipation  Constitutional : Fatigue  Skin: Negative for skin symptoms  Eyes: Negative for eye symptoms  Ear/Nose/Throat : Negative for Ear/Nose/Throat symptoms  Hematologic/Lymphatic: Negative for Hematologic/Lymphatic symptoms  Cardiovascular : Leg swelling  Respiratory : Negative for respiratory symptoms  Endocrine: Excessive thirst  Musculoskeletal: Back pain Joint pain  Neurological: Headaches  Psychologic: Depression

## 2020-01-13 NOTE — Progress Notes (Signed)
01/13/2020 10:38 AM   Stephanie Huang 1965-04-26 [id number]  Referring provider: No referring provider defined for this encounter.  Recurrent UTI  HPI: [name] is a 54yo here for evaluation of recurrent UTI.  She has had 6 UTIs in the past year. She has a hx of DMII and UA today shows 3+ glucose. She was diagnosed with DMII 2 years ago. Last A1c was 6.5. Over the past year she has noted urinary urgency, frequency, nocturia 2x, and a feeling of incomplete emptying. Stream fair.  PVR today is 202. Prior to this year she got rare UTIs.  She has numbness in her fingers and toes.   PMH: Past Medical History:  Diagnosis Date  . Anxiety   . Depression   . Fibromyalgia   . Fibromyalgia syndrome   . GERD (gastroesophageal reflux disease)   . GHWEXHBZ(169.6)    Mirgraine- January 2014- last one  . History of blood transfusion    after C- Section  . Hypertension   . IBS (irritable bowel syndrome)   . Left knee DJD     Surgical History: Past Surgical History:  Procedure Laterality Date  . CESAREAN SECTION    . KNEE ARTHROSCOPY Left 09/2011  . PARTIAL KNEE ARTHROPLASTY Left 05/10/2012   Procedure: UNICOMPARTMENTAL KNEE;  Surgeon: Nilda Simmer, MD;  Location: [facility];  Service: Orthopedics;  Laterality: Left;  . TUBAL LIGATION    . VAGINAL HYSTERECTOMY      Home Medications:  Allergies as of 01/13/2020      Reactions   Methotrexate Derivatives Other (See Comments)   Blisters in mouth   Lyrica [pregabalin] Nausea And Vomiting, Swelling      Medication List       Accurate as of January 13, 2020 10:38 AM. If you have any questions, ask your nurse or doctor.        STOP taking these medications   enoxaparin 30 MG/0.3ML injection Commonly known as: LOVENOX Stopped by: Wilkie Aye, MD     TAKE these medications   acetaminophen 325 MG tablet Commonly known as: TYLENOL Take 2 tablets (650 mg total) by mouth every 6 (six) hours as needed for pain or fever.     aspirin 81 MG chewable tablet Chew by mouth.   atorvastatin 40 MG tablet Commonly known as: LIPITOR Take by mouth.   bisacodyl 5 MG EC tablet Commonly known as: DULCOLAX Take 2 tablets every night with dinner until bowel movement.  LAXITIVE.  Restart if two days since last bowel movement   buPROPion 150 MG 24 hr tablet Commonly known as: WELLBUTRIN XL Take 150 mg by mouth daily.   celecoxib 200 MG capsule Commonly known as: CELEBREX Take 1 capsule (200 mg total) by mouth daily.   citalopram 20 MG tablet Commonly known as: CELEXA Take 20 mg by mouth daily.   clobetasol ointment 0.05 % Commonly known as: TEMOVATE Apply topically 2 (two) times daily as needed.   cyclobenzaprine 10 MG tablet Commonly known as: FLEXERIL Take 10 mg by mouth 3 (three) times daily as needed. For muscle spasms   DSS 100 MG Caps 1 tab 2 times a day while on narcotics.  STOOL SOFTENER   DULoxetine 60 MG capsule Commonly known as: CYMBALTA Take 60 mg by mouth 2 (two) times daily.   gabapentin 300 MG capsule Commonly known as: NEURONTIN Take 300 mg by mouth 3 (three) times daily as needed.   HYDROcodone-acetaminophen 5-325 MG tablet Commonly known as: NORCO/VICODIN  Take 1 tablet by mouth 4 (four) times daily.   hydrocortisone 2.5 % cream Apply topically daily.   lisinopril-hydrochlorothiazide 20-12.5 MG tablet Commonly known as: ZESTORETIC Take 1 tablet by mouth daily.   metFORMIN 500 MG tablet Commonly known as: GLUCOPHAGE Take by mouth.   metoprolol succinate 25 MG 24 hr tablet Commonly known as: TOPROL-XL Take 25 mg by mouth daily. What changed: Another medication with the same name was removed. Continue taking this medication, and follow the directions you see here. Changed by: Wilkie Aye, MD   norethindrone 5 MG tablet Commonly known as: AYGESTIN Take 5 mg by mouth daily.   omeprazole 20 MG capsule Commonly known as: PRILOSEC Take 20 mg by mouth daily.   Orencia  50 MG/0.4ML Sosy Generic drug: Abatacept Inject into the skin.   oxyCODONE 5 MG immediate release tablet Commonly known as: Oxy IR/ROXICODONE 1-2 tablets every 4-6 hrs as needed for pain   oxyCODONE 20 mg 12 hr tablet Commonly known as: OXYCONTIN Take 1 tablet (20 mg total) by mouth every 12 (twelve) hours.   Potassium 95 MG Tabs Take by mouth. What changed: Another medication with the same name was removed. Continue taking this medication, and follow the directions you see here. Changed by: Wilkie Aye, MD   tacrolimus 1 MG capsule Commonly known as: PROGRAF Dissolve 1 tablet in 1/2 L water. Swish and spit twice daily.   topiramate 100 MG tablet Commonly known as: TOPAMAX Take 200 mg by mouth daily.   zolpidem 10 MG tablet Commonly known as: AMBIEN Take 10 mg by mouth at bedtime as needed.       Allergies:  Allergies  Allergen Reactions  . Methotrexate Derivatives Other (See Comments)    Blisters in mouth  . Lyrica [Pregabalin] Nausea And Vomiting and Swelling    Family History: Family History  Problem Relation Age of Onset  . Cancer Father   . Pneumonia Mother     Social History:  reports that she has been smoking. She has a 30.00 pack-year smoking history. She has never used smokeless tobacco. She reports that she does not drink alcohol and does not use drugs.  ROS: All other review of systems were reviewed and are negative except what is noted above in HPI  Physical Exam: BP 113/75   Pulse (!) 112   Temp 98.5 F (36.9 C)   Ht 5\' 4"  (1.626 m)   Wt 262 lb (118.8 kg)   BMI 44.97 kg/m   Constitutional:  Alert and oriented, No acute distress. HEENT: [place] AT, moist mucus membranes.  Trachea midline, no masses. Cardiovascular: No clubbing, cyanosis, or edema. Respiratory: Normal respiratory effort, no increased work of breathing. GI: Abdomen is soft, nontender, nondistended, no abdominal masses GU: No CVA tenderness.  Lymph: No cervical or inguinal  lymphadenopathy. Skin: No rashes, bruises or suspicious lesions. Neurologic: Grossly intact, no focal deficits, moving all 4 extremities. Psychiatric: Normal mood and affect.  Laboratory Data: Lab Results  Component Value Date   WBC 18.1 (H) 05/11/2012   HGB 13.4 05/11/2012   HCT 39.9 05/11/2012   MCV 91.1 05/11/2012   PLT 276 05/11/2012    Lab Results  Component Value Date   CREATININE 0.50 05/11/2012    No results found for: PSA  No results found for: TESTOSTERONE  No results found for: HGBA1C  Urinalysis    Component Value Date/Time   COLORURINE YELLOW 05/03/2012 0945   APPEARANCEUR CLEAR 05/03/2012 0945   LABSPEC 1.006 05/03/2012 0945  PHURINE 5.0 05/03/2012 0945   GLUCOSEU NEGATIVE 05/03/2012 0945   HGBUR NEGATIVE 05/03/2012 0945   BILIRUBINUR NEGATIVE 05/03/2012 0945   KETONESUR NEGATIVE 05/03/2012 0945   PROTEINUR NEGATIVE 05/03/2012 0945   UROBILINOGEN 0.2 05/03/2012 0945   NITRITE NEGATIVE 05/03/2012 0945   LEUKOCYTESUR NEGATIVE 05/03/2012 0945    No results found for: LABMICR, WBCUA, RBCUA, LABEPIT, MUCUS, BACTERIA  Pertinent Imaging:  No results found for this or any previous visit.  No results found for this or any previous visit.  No results found for this or any previous visit.  No results found for this or any previous visit.  No results found for this or any previous visit.  No results found for this or any previous visit.  No results found for this or any previous visit.  No results found for this or any previous visit.   Assessment & Plan:    1. Urinary tract infection without hematuria, site unspecified -we will start on prophylaxis pending urodynamics study - BLADDER SCAN AMB NON-IMAGING - Urinalysis, Routine w reflex microscopic  2. Urinary retention -We discussed the various etiologies of urinary retentions. We discussed the role of UDS and the patient wishes to proceed with UDS   No follow-ups on file.  Wilkie Aye, MD  [facility] [place]

## 2020-01-13 NOTE — Patient Instructions (Signed)
Urinary Tract Infection, Adult A urinary tract infection (UTI) is an infection of any part of the urinary tract. The urinary tract includes:  The kidneys.  The ureters.  The bladder.  The urethra. These organs make, store, and get rid of pee (urine) in the body. What are the causes? This is caused by germs (bacteria) in your genital area. These germs grow and cause swelling (inflammation) of your urinary tract. What increases the risk? You are more likely to develop this condition if:  You have a small, thin tube (catheter) to drain pee.  You cannot control when you pee or poop (incontinence).  You are female, and: ? You use these methods to prevent pregnancy:  A medicine that kills sperm (spermicide).  A device that blocks sperm (diaphragm). ? You have low levels of a female hormone (estrogen). ? You are pregnant.  You have genes that add to your risk.  You are sexually active.  You take antibiotic medicines.  You have trouble peeing because of: ? A prostate that is bigger than normal, if you are female. ? A blockage in the part of your body that drains pee from the bladder (urethra). ? A kidney stone. ? A nerve condition that affects your bladder (neurogenic bladder). ? Not getting enough to drink. ? Not peeing often enough.  You have other conditions, such as: ? Diabetes. ? A weak disease-fighting system (immune system). ? Sickle cell disease. ? Gout. ? Injury of the spine. What are the signs or symptoms? Symptoms of this condition include:  Needing to pee right away (urgently).  Peeing often.  Peeing small amounts often.  Pain or burning when peeing.  Blood in the pee.  Pee that smells bad or not like normal.  Trouble peeing.  Pee that is cloudy.  Fluid coming from the vagina, if you are female.  Pain in the belly or lower back. Other symptoms include:  Throwing up (vomiting).  No urge to eat.  Feeling mixed up (confused).  Being tired  and grouchy (irritable).  A fever.  Watery poop (diarrhea). How is this treated? This condition may be treated with:  Antibiotic medicine.  Other medicines.  Drinking enough water. Follow these instructions at home:  Medicines  Take over-the-counter and prescription medicines only as told by your doctor.  If you were prescribed an antibiotic medicine, take it as told by your doctor. Do not stop taking it even if you start to feel better. General instructions  Make sure you: ? Pee until your bladder is empty. ? Do not hold pee for a long time. ? Empty your bladder after sex. ? Wipe from front to back after pooping if you are a female. Use each tissue one time when you wipe.  Drink enough fluid to keep your pee pale yellow.  Keep all follow-up visits as told by your doctor. This is important. Contact a doctor if:  You do not get better after 1-2 days.  Your symptoms go away and then come back. Get help right away if:  You have very bad back pain.  You have very bad pain in your lower belly.  You have a fever.  You are sick to your stomach (nauseous).  You are throwing up. Summary  A urinary tract infection (UTI) is an infection of any part of the urinary tract.  This condition is caused by germs in your genital area.  There are many risk factors for a UTI. These include having a small, thin   tube to drain pee and not being able to control when you pee or poop.  Treatment includes antibiotic medicines for germs.  Drink enough fluid to keep your pee pale yellow. This information is not intended to replace advice given to you by your health care provider. Make sure you discuss any questions you have with your health care provider. Document Revised: 02/25/2018 Document Reviewed: 09/17/2017 Elsevier Patient Education  2020 [employer]  

## 2020-01-16 LAB — URINALYSIS, ROUTINE W REFLEX MICROSCOPIC
Bilirubin, UA: NEGATIVE
Glucose, UA: NEGATIVE
Ketones, UA: NEGATIVE
Leukocytes,UA: NEGATIVE
Nitrite, UA: NEGATIVE
Protein,UA: NEGATIVE
Specific Gravity, UA: 1.01 (ref 1.005–1.030)
Urobilinogen, Ur: 0.2 mg/dL (ref 0.2–1.0)
pH, UA: 5.5 (ref 5.0–7.5)

## 2020-01-16 LAB — MICROSCOPIC EXAMINATION
RBC: NONE SEEN /hpf (ref 0–2)
Renal Epithel, UA: NONE SEEN /hpf
WBC, UA: NONE SEEN /hpf (ref 0–5)

## 2020-01-19 DIAGNOSIS — R3915 Urgency of urination: Secondary | ICD-10-CM | POA: Diagnosis not present

## 2020-01-19 DIAGNOSIS — N3941 Urge incontinence: Secondary | ICD-10-CM | POA: Diagnosis not present

## 2020-01-19 DIAGNOSIS — R350 Frequency of micturition: Secondary | ICD-10-CM | POA: Diagnosis not present

## 2020-02-24 ENCOUNTER — Other Ambulatory Visit: Payer: Self-pay

## 2020-02-24 ENCOUNTER — Encounter: Payer: Self-pay | Admitting: Urology

## 2020-02-24 ENCOUNTER — Ambulatory Visit (INDEPENDENT_AMBULATORY_CARE_PROVIDER_SITE_OTHER): Payer: Managed Care, Other (non HMO) | Admitting: Urology

## 2020-02-24 VITALS — BP 100/70 | HR 106 | Temp 97.9°F | Ht 64.0 in | Wt 262.0 lb

## 2020-02-24 DIAGNOSIS — N390 Urinary tract infection, site not specified: Secondary | ICD-10-CM | POA: Diagnosis not present

## 2020-02-24 DIAGNOSIS — N3281 Overactive bladder: Secondary | ICD-10-CM

## 2020-02-24 LAB — URINALYSIS, ROUTINE W REFLEX MICROSCOPIC
Bilirubin, UA: NEGATIVE
Ketones, UA: NEGATIVE
Leukocytes,UA: NEGATIVE
Nitrite, UA: NEGATIVE
Protein,UA: NEGATIVE
Specific Gravity, UA: 1.02 (ref 1.005–1.030)
Urobilinogen, Ur: 0.2 mg/dL (ref 0.2–1.0)
pH, UA: 5 (ref 5.0–7.5)

## 2020-02-24 LAB — MICROSCOPIC EXAMINATION: Renal Epithel, UA: NONE SEEN /hpf

## 2020-02-24 MED ORDER — NITROFURANTOIN MACROCRYSTAL 50 MG PO CAPS: 50.0000 mg | ORAL_CAPSULE | Freq: Every day | ORAL | 11 refills | Status: DC

## 2020-02-24 NOTE — Patient Instructions (Signed)

## 2020-02-24 NOTE — Progress Notes (Signed)
Urological Symptom Review  Patient is experiencing the following symptoms: Frequent urination Get up at night to urinate Leakage of urine Stream starts and stops Have to strain to urinate Urinary tract infection   Review of Systems  Gastrointestinal (upper)  : Nausea Indigestion/heartburn  Gastrointestinal (lower) : Negative for lower GI symptoms  Constitutional : Fatigue  Skin: Negative for skin symptoms  Eyes: Negative for eye symptoms  Ear/Nose/Throat : Negative for Ear/Nose/Throat symptoms  Hematologic/Lymphatic: Easy bruising  Cardiovascular : Leg swelling  Respiratory : Cough Shortness of breath Negative for respiratory symptoms  Endocrine: Excessive thirst  Musculoskeletal: Back pain Joint pain  Neurological: Headaches  Psychologic: Depression

## 2020-02-24 NOTE — Progress Notes (Signed)
02/24/2020 11:14 AM   Stephanie Huang Sep 27, 1965 [id number]  Referring provider: Richardean Chimera, MD [address] [place],  [place] [address]  followup OAb and recurrent UTI  HPI: Stephanie Huang is a 54yo here for followup for recurrent UTI and OAB. She was started on macrobid 50mg  qhs last visit. No UTI since last visit. No dysuria or hematuria. She continues to have urinary urgency, frequency and occasional urge incontinence. She denies incomplete emptying. Stream strong. She is doing timed voiding and fluid management for her OAB.    PMH: Past Medical History:  Diagnosis Date  . Anxiety   . Depression   . Fibromyalgia   . Fibromyalgia syndrome   . GERD (gastroesophageal reflux disease)   . )    Mirgraine- January 2014- last one  . History of blood transfusion    after C- Section  . Hypertension   . IBS (irritable bowel syndrome)   . Left knee DJD     Surgical History: Past Surgical History:  Procedure Laterality Date  . CESAREAN SECTION    . KNEE ARTHROSCOPY Left 09/2011  . PARTIAL KNEE ARTHROPLASTY Left 05/10/2012   Procedure: UNICOMPARTMENTAL KNEE;  Surgeon: 05/12/2012, MD;  Location: [facility];  Service: Orthopedics;  Laterality: Left;  . TUBAL LIGATION    . VAGINAL HYSTERECTOMY      Home Medications:  Allergies as of 02/24/2020      Reactions   Methotrexate Derivatives Other (See Comments)   Blisters in mouth   Lyrica [pregabalin] Nausea And Vomiting, Swelling      Medication List       Accurate as of February 24, 2020 11:14 AM. If you have any questions, ask your nurse or doctor.        acetaminophen 325 MG tablet Commonly known as: TYLENOL Take 2 tablets (650 mg total) by mouth every 6 (six) hours as needed for pain or fever.   Anoro Ellipta 62.5-25 MCG/INH Aepb Generic drug: umeclidinium-vilanterol 1 puff daily.   aspirin 81 MG chewable tablet Chew by mouth.   atorvastatin 40 MG tablet Commonly known as: LIPITOR Take by mouth.    bisacodyl 5 MG EC tablet Commonly known as: DULCOLAX Take 2 tablets every night with dinner until bowel movement.  LAXITIVE.  Restart if two days since last bowel movement   buPROPion 150 MG 24 hr tablet Commonly known as: WELLBUTRIN XL Take 150 mg by mouth daily.   celecoxib 200 MG capsule Commonly known as: CELEBREX Take 1 capsule (200 mg total) by mouth daily.   citalopram 20 MG tablet Commonly known as: CELEXA Take 20 mg by mouth daily.   clobetasol ointment 0.05 % Commonly known as: TEMOVATE Apply topically 2 (two) times daily as needed.   cyclobenzaprine 10 MG tablet Commonly known as: FLEXERIL Take 10 mg by mouth 3 (three) times daily as needed. For muscle spasms   DSS 100 MG Caps 1 tab 2 times a day while on narcotics.  STOOL SOFTENER   DULoxetine 60 MG capsule Commonly known as: CYMBALTA Take 60 mg by mouth 2 (two) times daily.   Enbrel SureClick 50 MG/ML injection Generic drug: etanercept Inject into the skin.   fluconazole 150 MG tablet Commonly known as: DIFLUCAN Take 150 mg by mouth once.   folic acid 1 MG tablet Commonly known as: FOLVITE Take by mouth.   gabapentin 300 MG capsule Commonly known as: NEURONTIN Take 300 mg by mouth 3 (three) times daily as needed.  glipiZIDE 10 MG 24 hr tablet Commonly known as: GLUCOTROL XL Take 10 mg by mouth daily.   HYDROcodone-acetaminophen 5-325 MG tablet Commonly known as: NORCO/VICODIN Take 1 tablet by mouth 4 (four) times daily.   hydrocortisone 2.5 % cream Apply topically daily.   lisinopril-hydrochlorothiazide 20-12.5 MG tablet Commonly known as: ZESTORETIC Take 1 tablet by mouth daily.   metFORMIN 500 MG tablet Commonly known as: GLUCOPHAGE Take by mouth.   metoprolol succinate 25 MG 24 hr tablet Commonly known as: TOPROL-XL Take 25 mg by mouth daily.   nitrofurantoin 50 MG capsule Commonly known as: MACRODANTIN Take 1 capsule (50 mg total) by mouth 4 (four) times daily.    norethindrone 5 MG tablet Commonly known as: AYGESTIN Take 5 mg by mouth daily.   omeprazole 20 MG capsule Commonly known as: PRILOSEC Take 20 mg by mouth daily.   Orencia 50 MG/0.4ML Sosy Generic drug: Abatacept Inject into the skin.   oxyCODONE 5 MG immediate release tablet Commonly known as: Oxy IR/ROXICODONE 1-2 tablets every 4-6 hrs as needed for pain   oxyCODONE 20 mg 12 hr tablet Commonly known as: OXYCONTIN Take 1 tablet (20 mg total) by mouth every 12 (twelve) hours.   Potassium 95 MG Tabs Take by mouth.   tacrolimus 1 MG capsule Commonly known as: PROGRAF Dissolve 1 tablet in 1/2 L water. Swish and spit twice daily.   topiramate 100 MG tablet Commonly known as: TOPAMAX Take 200 mg by mouth daily.   zolpidem 10 MG tablet Commonly known as: AMBIEN Take 10 mg by mouth at bedtime as needed.       Allergies:  Allergies  Allergen Reactions  . Methotrexate Derivatives Other (See Comments)    Blisters in mouth  . Lyrica [Pregabalin] Nausea And Vomiting and Swelling    Family History: Family History  Problem Relation Age of Onset  . Cancer Father   . Pneumonia Mother     Social History:  reports that she has been smoking. She has a 30.00 pack-year smoking history. She has never used smokeless tobacco. She reports that she does not drink alcohol and does not use drugs.  ROS: All other review of systems were reviewed and are negative except what is noted above in HPI  Physical Exam: BP 100/70   Pulse (!) 106   Temp 97.9 F (36.6 C)   Ht  (1.626 m)   Wt 262 lb (118.8 kg)   BMI 44.97 kg/m   Constitutional:  Alert and oriented, No acute distress. HEENT: [place] AT, moist mucus membranes.  Trachea midline, no masses. Cardiovascular: No clubbing, cyanosis, or edema. Respiratory: Normal respiratory effort, no increased work of breathing. GI: Abdomen is soft, nontender, nondistended, no abdominal masses GU: No CVA tenderness.  Lymph: No cervical or  inguinal lymphadenopathy. Skin: No rashes, bruises or suspicious lesions. Neurologic: Grossly intact, no focal deficits, moving all 4 extremities. Psychiatric: Normal mood and affect.  Laboratory Data: Lab Results  Component Value Date   WBC 18.1 (H) 05/11/2012   HGB 13.4 05/11/2012   HCT 39.9 05/11/2012   MCV 91.1 05/11/2012   PLT 276 05/11/2012    Lab Results  Component Value Date   CREATININE 0.50 05/11/2012    No results found for: PSA  No results found for: TESTOSTERONE  No results found for: HGBA1C  Urinalysis    Component Value Date/Time   COLORURINE YELLOW 05/03/2012 0945   APPEARANCEUR Clear 01/13/2020 1335   LABSPEC 1.006 05/03/2012 0945   PHURINE  5.0 05/03/2012 0945   GLUCOSEU Negative 01/13/2020 1335   HGBUR NEGATIVE 05/03/2012 0945   BILIRUBINUR Negative 01/13/2020 1335   KETONESUR NEGATIVE 05/03/2012 0945   PROTEINUR Negative 01/13/2020 1335   PROTEINUR NEGATIVE 05/03/2012 0945   UROBILINOGEN 0.2 05/03/2012 0945   NITRITE Negative 01/13/2020 1335   NITRITE NEGATIVE 05/03/2012 0945   LEUKOCYTESUR Negative 01/13/2020 1335    Lab Results  Component Value Date   LABMICR See below: 01/13/2020   WBCUA None seen 01/13/2020   LABEPIT 0-10 01/13/2020   BACTERIA Few 01/13/2020    Pertinent Imaging:  No results found for this or any previous visit.  No results found for this or any previous visit.  No results found for this or any previous visit.  No results found for this or any previous visit.  No results found for this or any previous visit.  No results found for this or any previous visit.  No results found for this or any previous visit.  No results found for this or any previous visit.   Assessment & Plan:    1. Urinary tract infection without hematuria, site unspecified -contiue macrobid 50mg  QHS  2. OAB (overactive bladder) -patient defers treatment at this time   Return in about 3 months (around 05/24/2020) for PVR.  07/24/2020, MD  [facility] [place]

## 2020-02-29 DIAGNOSIS — G8929 Other chronic pain: Secondary | ICD-10-CM | POA: Diagnosis not present

## 2020-02-29 DIAGNOSIS — Z6841 Body Mass Index (BMI) 40.0 and over, adult: Secondary | ICD-10-CM | POA: Diagnosis not present

## 2020-02-29 DIAGNOSIS — M797 Fibromyalgia: Secondary | ICD-10-CM | POA: Diagnosis not present

## 2020-02-29 DIAGNOSIS — M0579 Rheumatoid arthritis with rheumatoid factor of multiple sites without organ or systems involvement: Secondary | ICD-10-CM | POA: Diagnosis not present

## 2020-02-29 DIAGNOSIS — L439 Lichen planus, unspecified: Secondary | ICD-10-CM | POA: Diagnosis not present

## 2020-02-29 DIAGNOSIS — M2550 Pain in unspecified joint: Secondary | ICD-10-CM | POA: Diagnosis not present

## 2020-05-11 ENCOUNTER — Other Ambulatory Visit: Payer: Self-pay

## 2020-05-11 ENCOUNTER — Telehealth: Payer: Self-pay

## 2020-05-11 NOTE — Telephone Encounter (Signed)
Patient had called wanting to know when her next appointment was with Dr. Ronne Binning. Gave her date and time.

## 2020-05-14 NOTE — Telephone Encounter (Signed)
Error

## 2020-05-15 DIAGNOSIS — E8881 Metabolic syndrome: Secondary | ICD-10-CM | POA: Diagnosis not present

## 2020-05-15 DIAGNOSIS — L430 Hypertrophic lichen planus: Secondary | ICD-10-CM | POA: Diagnosis not present

## 2020-05-15 DIAGNOSIS — J441 Chronic obstructive pulmonary disease with (acute) exacerbation: Secondary | ICD-10-CM | POA: Diagnosis not present

## 2020-05-15 DIAGNOSIS — E782 Mixed hyperlipidemia: Secondary | ICD-10-CM | POA: Diagnosis not present

## 2020-05-15 DIAGNOSIS — I10 Essential (primary) hypertension: Secondary | ICD-10-CM | POA: Diagnosis not present

## 2020-05-15 DIAGNOSIS — E7849 Other hyperlipidemia: Secondary | ICD-10-CM | POA: Diagnosis not present

## 2020-05-15 DIAGNOSIS — R4582 Worries: Secondary | ICD-10-CM | POA: Diagnosis not present

## 2020-05-15 DIAGNOSIS — E1169 Type 2 diabetes mellitus with other specified complication: Secondary | ICD-10-CM | POA: Diagnosis not present

## 2020-05-25 ENCOUNTER — Ambulatory Visit (INDEPENDENT_AMBULATORY_CARE_PROVIDER_SITE_OTHER): Payer: Managed Care, Other (non HMO) | Admitting: Urology

## 2020-05-25 ENCOUNTER — Encounter: Payer: Self-pay | Admitting: Urology

## 2020-05-25 ENCOUNTER — Other Ambulatory Visit: Payer: Self-pay

## 2020-05-25 VITALS — BP 107/73 | HR 99 | Temp 98.4°F | Ht 64.0 in | Wt 251.0 lb

## 2020-05-25 DIAGNOSIS — N3281 Overactive bladder: Secondary | ICD-10-CM | POA: Diagnosis not present

## 2020-05-25 DIAGNOSIS — N390 Urinary tract infection, site not specified: Secondary | ICD-10-CM | POA: Diagnosis not present

## 2020-05-25 LAB — URINALYSIS, ROUTINE W REFLEX MICROSCOPIC
Bilirubin, UA: NEGATIVE
Ketones, UA: NEGATIVE
Leukocytes,UA: NEGATIVE
Nitrite, UA: NEGATIVE
Protein,UA: NEGATIVE
Specific Gravity, UA: 1.015 (ref 1.005–1.030)
Urobilinogen, Ur: 0.2 mg/dL (ref 0.2–1.0)
pH, UA: 6.5 (ref 5.0–7.5)

## 2020-05-25 LAB — MICROSCOPIC EXAMINATION
Epithelial Cells (non renal): >10 /hpf — AB (ref 0–10)
Renal Epithel, UA: NONE SEEN /hpf

## 2020-05-25 MED ORDER — FLUCONAZOLE 150 MG PO TABS: 150.0000 mg | ORAL_TABLET | Freq: Every day | ORAL | 3 refills | Status: DC

## 2020-05-25 MED ORDER — NITROFURANTOIN MACROCRYSTAL 50 MG PO CAPS: 50.0000 mg | ORAL_CAPSULE | Freq: Every day | ORAL | 3 refills | Status: DC

## 2020-05-25 NOTE — Progress Notes (Signed)
05/25/2020 10:57 AM   Stephanie Huang Fix 06/18/1965 [id number]  Referring provider: Richardean Chimera, MD [address] [place],  [place] [address]  Recurrent UTI and OAB  HPI: [name] is a 54yo here for followup for recurrent UTI and OAB. No UTI since last visit. She is on macrobid 50mg  qhs. No worsening LUTS. She continues timed voiding and fluid management for her OAB. Overall she is happy with her urination   PMH: Past Medical History:  Diagnosis Date  . Anxiety   . Depression   . Fibromyalgia   . Fibromyalgia syndrome   . GERD (gastroesophageal reflux disease)   . )    Mirgraine- January 2014- last one  . History of blood transfusion    after C- Section  . Hypertension   . IBS (irritable bowel syndrome)   . Left knee DJD     Surgical History: Past Surgical History:  Procedure Laterality Date  . CESAREAN SECTION    . KNEE ARTHROSCOPY Left 09/2011  . PARTIAL KNEE ARTHROPLASTY Left 05/10/2012   Procedure: UNICOMPARTMENTAL KNEE;  Surgeon: 05/12/2012, MD;  Location: [facility] OR;  Service: Orthopedics;  Laterality: Left;  . TUBAL LIGATION    . VAGINAL HYSTERECTOMY      Home Medications:  Allergies as of 05/25/2020      Reactions   Methotrexate Derivatives Other (See Comments)   Blisters in mouth   Lyrica [pregabalin] Nausea And Vomiting, Swelling      Medication List       Accurate as of May 25, 2020 10:57 AM. If you have any questions, ask your nurse or doctor.        acetaminophen 325 MG tablet Commonly known as: TYLENOL Take 2 tablets (650 mg total) by mouth every 6 (six) hours as needed for pain or fever.   Anoro Ellipta 62.5-25 MCG/INH Aepb Generic drug: umeclidinium-vilanterol 1 puff daily.   aspirin 81 MG chewable tablet Chew by mouth.   atorvastatin 40 MG tablet Commonly known as: LIPITOR Take by mouth.   bisacodyl 5 MG EC tablet Commonly known as: DULCOLAX Take 2 tablets every night with dinner until bowel movement.  LAXITIVE.   Restart if two days since last bowel movement   buPROPion 150 MG 24 hr tablet Commonly known as: WELLBUTRIN XL Take 150 mg by mouth daily.   celecoxib 200 MG capsule Commonly known as: CELEBREX Take 1 capsule (200 mg total) by mouth daily.   citalopram 20 MG tablet Commonly known as: CELEXA Take 20 mg by mouth daily.   clobetasol ointment 0.05 % Commonly known as: TEMOVATE Apply topically 2 (two) times daily as needed.   cyclobenzaprine 10 MG tablet Commonly known as: FLEXERIL Take 10 mg by mouth 3 (three) times daily as needed. For muscle spasms   DSS 100 MG Caps 1 tab 2 times a day while on narcotics.  STOOL SOFTENER   DULoxetine 60 MG capsule Commonly known as: CYMBALTA Take 60 mg by mouth 2 (two) times daily.   Enbrel SureClick 50 MG/ML injection Generic drug: etanercept Inject into the skin.   fluconazole 150 MG tablet Commonly known as: DIFLUCAN Take 150 mg by mouth once.   folic acid 1 MG tablet Commonly known as: FOLVITE Take by mouth.   gabapentin 300 MG capsule Commonly known as: NEURONTIN Take 300 mg by mouth 3 (three) times daily as needed.   glipiZIDE 10 MG 24 hr tablet Commonly known as: GLUCOTROL XL Take 10 mg by mouth  daily.   HYDROcodone-acetaminophen 5-325 MG tablet Commonly known as: NORCO/VICODIN Take 1 tablet by mouth 4 (four) times daily.   hydrocortisone 2.5 % cream Apply topically daily.   lisinopril-hydrochlorothiazide 20-12.5 MG tablet Commonly known as: ZESTORETIC Take 1 tablet by mouth daily.   metFORMIN 500 MG tablet Commonly known as: GLUCOPHAGE Take by mouth.   metoprolol succinate 25 MG 24 hr tablet Commonly known as: TOPROL-XL Take 25 mg by mouth daily.   nitrofurantoin 50 MG capsule Commonly known as: MACRODANTIN Take 1 capsule (50 mg total) by mouth at bedtime.   norethindrone 5 MG tablet Commonly known as: AYGESTIN Take 5 mg by mouth daily.   omeprazole 20 MG capsule Commonly known as: PRILOSEC Take 20  mg by mouth daily.   Orencia 50 MG/0.4ML Sosy Generic drug: Abatacept Inject into the skin.   oxyCODONE 5 MG immediate release tablet Commonly known as: Oxy IR/ROXICODONE 1-2 tablets every 4-6 hrs as needed for pain   oxyCODONE 20 mg 12 hr tablet Commonly known as: OXYCONTIN Take 1 tablet (20 mg total) by mouth every 12 (twelve) hours.   Potassium 95 MG Tabs Take by mouth.   tacrolimus 1 MG capsule Commonly known as: PROGRAF Dissolve 1 tablet in 1/2 L water. Swish and spit twice daily.   topiramate 100 MG tablet Commonly known as: TOPAMAX Take 200 mg by mouth daily.   Trulicity 1.5 MG/0.5ML Sopn Generic drug: Dulaglutide Inject 1.5 mg into the skin once a week.   zolpidem 10 MG tablet Commonly known as: AMBIEN Take 10 mg by mouth at bedtime as needed.       Allergies:  Allergies  Allergen Reactions  . Methotrexate Derivatives Other (See Comments)    Blisters in mouth  . Lyrica [Pregabalin] Nausea And Vomiting and Swelling    Family History: Family History  Problem Relation Age of Onset  . Cancer Father   . Pneumonia Mother     Social History:  reports that she has been smoking. She has a 30.00 pack-year smoking history. She has never used smokeless tobacco. She reports that she does not drink alcohol and does not use drugs.  ROS: All other review of systems were reviewed and are negative except what is noted above in HPI  Physical Exam: BP 107/73   Pulse 99   Temp 98.4 F (36.9 C)   Ht 5\' 4"  (1.626 m)   Wt 251 lb (113.9 kg)   BMI 43.08 kg/m   Constitutional:  Alert and oriented, No acute distress. HEENT: [place] AT, moist mucus membranes.  Trachea midline, no masses. Cardiovascular: No clubbing, cyanosis, or edema. Respiratory: Normal respiratory effort, no increased work of breathing. GI: Abdomen is soft, nontender, nondistended, no abdominal masses GU: No CVA tenderness.  Lymph: No cervical or inguinal lymphadenopathy. Skin: No rashes, bruises or  suspicious lesions. Neurologic: Grossly intact, no focal deficits, moving all 4 extremities. Psychiatric: Normal mood and affect.  Laboratory Data: Lab Results  Component Value Date   WBC 18.1 (H) 05/11/2012   HGB 13.4 05/11/2012   HCT 39.9 05/11/2012   MCV 91.1 05/11/2012   PLT 276 05/11/2012    Lab Results  Component Value Date   CREATININE 0.50 05/11/2012    No results found for: PSA  No results found for: TESTOSTERONE  No results found for: HGBA1C  Urinalysis    Component Value Date/Time   COLORURINE YELLOW 05/03/2012 0945   APPEARANCEUR Clear 02/24/2020 1316   LABSPEC 1.006 05/03/2012 0945   PHURINE 5.0  05/03/2012 0945   GLUCOSEU 1+ (A) 02/24/2020 1316   HGBUR NEGATIVE 05/03/2012 0945   BILIRUBINUR Negative 02/24/2020 1316   KETONESUR NEGATIVE 05/03/2012 0945   PROTEINUR Negative 02/24/2020 1316   PROTEINUR NEGATIVE 05/03/2012 0945   UROBILINOGEN 0.2 05/03/2012 0945   NITRITE Negative 02/24/2020 1316   NITRITE NEGATIVE 05/03/2012 0945   LEUKOCYTESUR Negative 02/24/2020 1316    Lab Results  Component Value Date   LABMICR See below: 02/24/2020   WBCUA 0-5 02/24/2020   LABEPIT 0-10 02/24/2020   BACTERIA Few (A) 02/24/2020    Pertinent Imaging:  No results found for this or any previous visit.  No results found for this or any previous visit.  No results found for this or any previous visit.  No results found for this or any previous visit.  No results found for this or any previous visit.  No results found for this or any previous visit.  No results found for this or any previous visit.  No results found for this or any previous visit.   Assessment & Plan:    1. OAB (overactive bladder) -continue fluid management and timed voiding - Urinalysis, Routine w reflex microscopic - Bladder Scan (Post Void Residual) in office  2. Recurrent UTI -macrobid 50mg  qhs   No follow-ups on file.  Wilkie Aye, MD  [facility]  [place]

## 2020-05-25 NOTE — Patient Instructions (Signed)
Overactive Bladder, Adult  Overactive bladder is a condition in which a person has a sudden and frequent need to urinate. A person might also leak urine if he or she cannot get to the bathroom fast enough (urinary incontinence). Sometimes, symptoms can interfere with work or social activities. What are the causes? Overactive bladder is associated with poor nerve signals between your bladder and your brain. Your bladder may get the signal to empty before it is full. You may also have very sensitive muscles that make your bladder squeeze too soon. This condition may also be caused by other factors, such as:  Medical conditions: ? Urinary tract infection. ? Infection of nearby tissues. ? Prostate enlargement. ? Bladder stones, inflammation, or tumors. ? Diabetes. ? Muscle or nerve weakness, especially from these conditions:  A spinal cord injury.  Stroke.  Multiple sclerosis.  Parkinson's disease.  Other causes: ? Surgery on the uterus or urethra. ? Drinking too much caffeine or alcohol. ? Certain medicines, especially those that eliminate extra fluid in the body (diuretics). ? Constipation. What increases the risk? You may be at greater risk for overactive bladder if you:  Are an older adult.  Smoke.  Are going through menopause.  Have prostate problems.  Have a neurological disease, such as stroke, dementia, Parkinson's disease, or multiple sclerosis (MS).  Eat or drink alcohol, spicy food, caffeine, and other things that irritate the bladder.  Are overweight or obese. What are the signs or symptoms? Symptoms of this condition include a sudden, strong urge to urinate. Other symptoms include:  Leaking urine.  Urinating 8 or more times a day.  Waking up to urinate 2 or more times overnight. How is this diagnosed? This condition may be diagnosed based on:  Your symptoms and medical history.  A physical exam.  Blood or urine tests to check for possible causes,  such as infection. You may also need to see a health care provider who specializes in urinary tract problems. This is called a urologist. How is this treated? Treatment for overactive bladder depends on the cause of your condition and whether it is mild or severe. Treatment may include:  Bladder training, such as: ? Learning to control the urge to urinate by following a schedule to urinate at regular intervals. ? Doing Kegel exercises to strengthen the pelvic floor muscles that support your bladder.  Special devices, such as: ? Biofeedback. This uses sensors to help you become aware of your body's signals. ? Electrical stimulation. This uses electrodes placed inside the body (implanted) or outside the body. These electrodes send gentle pulses of electricity to strengthen the nerves or muscles that control the bladder. ? Women may use a plastic device, called a pessary, that fits into the vagina and supports the bladder.  Medicines, such as: ? Antibiotics to treat bladder infection. ? Antispasmodics to stop the bladder from releasing urine at the wrong time. ? Tricyclic antidepressants to relax bladder muscles. ? Injections of botulinum toxin type A directly into the bladder tissue to relax bladder muscles.  Surgery, such as: ? A device may be implanted to help manage the nerve signals that control urination. ? An electrode may be implanted to stimulate electrical signals in the bladder. ? A procedure may be done to change the shape of the bladder. This is done only in very severe cases. Follow these instructions at home: Eating and drinking  Make diet or lifestyle changes recommended by your health care provider. These may include: ? Drinking fluids   throughout the day and not only with meals. ? Cutting down on caffeine or alcohol. ? Eating a healthy and balanced diet to prevent constipation. This may include:  Choosing foods that are high in fiber, such as beans, whole grains, and  fresh fruits and vegetables.  Limiting foods that are high in fat and processed sugars, such as fried and sweet foods.   Lifestyle  Lose weight if needed.  Do not use any products that contain nicotine or tobacco. These include cigarettes, chewing tobacco, and vaping devices, such as e-cigarettes. If you need help quitting, ask your health care provider.   General instructions  Take over-the-counter and prescription medicines only as told by your health care provider.  If you were prescribed an antibiotic medicine, take it as told by your health care provider. Do not stop taking the antibiotic even if you start to feel better.  Use any implants or pessary as told by your health care provider.  If needed, wear pads to absorb urine leakage.  Keep a log to track how much and when you drink, and when you need to urinate. This will help your health care provider monitor your condition.  Keep all follow-up visits. This is important. Contact a health care provider if:  You have a fever or chills.  Your symptoms do not get better with treatment.  Your pain and discomfort get worse.  You have more frequent urges to urinate. Get help right away if:  You are not able to control your bladder. Summary  Overactive bladder refers to a condition in which a person has a sudden and frequent need to urinate.  Several conditions may lead to an overactive bladder.  Treatment for overactive bladder depends on the cause and severity of your condition.  Making lifestyle changes, doing Kegel exercises, keeping a log, and taking medicines can help with this condition. This information is not intended to replace advice given to you by your health care provider. Make sure you discuss any questions you have with your health care provider. Document Revised: 11/28/2019 Document Reviewed: 11/28/2019 Elsevier Patient Education  2021 [employer] Inc[employer]  

## 2020-05-25 NOTE — Progress Notes (Signed)
Bladder Scan Patient can void: 1 ml Performed By: Bridgette Habermann, lpn   Urological Symptom Review  Patient is experiencing the following symptoms: Frequent urination Hard to postpone urination Get up at night to urinate Leakage of urine Stream starts and stops Have to strain to urinate Urinary tract infection   Review of Systems  Gastrointestinal (upper)  : Indigestion/heartburn  Gastrointestinal (lower) : Constipation  Constitutional : Night Sweats Fatigue  Skin: Skin rash/lesion  Eyes: Negative for eye symptoms  Ear/Nose/Throat : Negative for Ear/Nose/Throat symptoms  Hematologic/Lymphatic: Negative for Hematologic/Lymphatic symptoms  Cardiovascular : Leg swelling  Respiratory : Shortness of breath  Endocrine: Excessive thirst  Musculoskeletal: Back pain Joint pain  Neurological: Headaches  Psychologic: Depression

## 2020-07-25 DIAGNOSIS — I10 Essential (primary) hypertension: Secondary | ICD-10-CM | POA: Diagnosis not present

## 2020-07-25 DIAGNOSIS — E8881 Metabolic syndrome: Secondary | ICD-10-CM | POA: Diagnosis not present

## 2020-07-25 DIAGNOSIS — E1169 Type 2 diabetes mellitus with other specified complication: Secondary | ICD-10-CM | POA: Diagnosis not present

## 2020-07-25 DIAGNOSIS — L430 Hypertrophic lichen planus: Secondary | ICD-10-CM | POA: Diagnosis not present

## 2020-07-25 DIAGNOSIS — J441 Chronic obstructive pulmonary disease with (acute) exacerbation: Secondary | ICD-10-CM | POA: Diagnosis not present

## 2020-07-25 DIAGNOSIS — K589 Irritable bowel syndrome without diarrhea: Secondary | ICD-10-CM | POA: Diagnosis not present

## 2020-07-25 DIAGNOSIS — E7849 Other hyperlipidemia: Secondary | ICD-10-CM | POA: Diagnosis not present

## 2020-07-25 DIAGNOSIS — J449 Chronic obstructive pulmonary disease, unspecified: Secondary | ICD-10-CM | POA: Diagnosis not present

## 2020-08-22 DIAGNOSIS — I10 Essential (primary) hypertension: Secondary | ICD-10-CM | POA: Diagnosis not present

## 2020-08-22 DIAGNOSIS — J449 Chronic obstructive pulmonary disease, unspecified: Secondary | ICD-10-CM | POA: Diagnosis not present

## 2020-08-22 DIAGNOSIS — E7849 Other hyperlipidemia: Secondary | ICD-10-CM | POA: Diagnosis not present

## 2020-08-22 DIAGNOSIS — R4582 Worries: Secondary | ICD-10-CM | POA: Diagnosis not present

## 2020-08-22 DIAGNOSIS — E8881 Metabolic syndrome: Secondary | ICD-10-CM | POA: Diagnosis not present

## 2020-08-22 DIAGNOSIS — Z79891 Long term (current) use of opiate analgesic: Secondary | ICD-10-CM | POA: Diagnosis not present

## 2020-08-22 DIAGNOSIS — M05711 Rheumatoid arthritis with rheumatoid factor of right shoulder without organ or systems involvement: Secondary | ICD-10-CM | POA: Diagnosis not present

## 2020-08-22 DIAGNOSIS — J441 Chronic obstructive pulmonary disease with (acute) exacerbation: Secondary | ICD-10-CM | POA: Diagnosis not present

## 2020-09-08 ENCOUNTER — Other Ambulatory Visit: Payer: Self-pay | Admitting: Urology

## 2020-09-08 DIAGNOSIS — N3281 Overactive bladder: Secondary | ICD-10-CM

## 2020-11-05 DIAGNOSIS — Z6841 Body Mass Index (BMI) 40.0 and over, adult: Secondary | ICD-10-CM | POA: Diagnosis not present

## 2020-11-05 DIAGNOSIS — E1165 Type 2 diabetes mellitus with hyperglycemia: Secondary | ICD-10-CM | POA: Diagnosis not present

## 2020-11-05 DIAGNOSIS — F17210 Nicotine dependence, cigarettes, uncomplicated: Secondary | ICD-10-CM | POA: Diagnosis not present

## 2020-11-27 DIAGNOSIS — F331 Major depressive disorder, recurrent, moderate: Secondary | ICD-10-CM | POA: Diagnosis not present

## 2020-11-27 DIAGNOSIS — J441 Chronic obstructive pulmonary disease with (acute) exacerbation: Secondary | ICD-10-CM | POA: Diagnosis not present

## 2020-11-27 DIAGNOSIS — E8881 Metabolic syndrome: Secondary | ICD-10-CM | POA: Diagnosis not present

## 2020-11-27 DIAGNOSIS — I10 Essential (primary) hypertension: Secondary | ICD-10-CM | POA: Diagnosis not present

## 2020-11-27 DIAGNOSIS — E7849 Other hyperlipidemia: Secondary | ICD-10-CM | POA: Diagnosis not present

## 2020-11-27 DIAGNOSIS — Z0001 Encounter for general adult medical examination with abnormal findings: Secondary | ICD-10-CM | POA: Diagnosis not present

## 2020-11-27 DIAGNOSIS — Z23 Encounter for immunization: Secondary | ICD-10-CM | POA: Diagnosis not present

## 2020-11-27 DIAGNOSIS — M05711 Rheumatoid arthritis with rheumatoid factor of right shoulder without organ or systems involvement: Secondary | ICD-10-CM | POA: Diagnosis not present

## 2020-11-29 ENCOUNTER — Other Ambulatory Visit: Payer: Self-pay | Admitting: Urology

## 2020-11-29 DIAGNOSIS — N3281 Overactive bladder: Secondary | ICD-10-CM

## 2020-11-30 ENCOUNTER — Ambulatory Visit: Payer: Medicare Other | Admitting: Urology

## 2020-12-25 ENCOUNTER — Telehealth: Payer: Medicare Other | Admitting: Urology

## 2020-12-25 ENCOUNTER — Other Ambulatory Visit: Payer: Self-pay

## 2020-12-25 DIAGNOSIS — N390 Urinary tract infection, site not specified: Secondary | ICD-10-CM

## 2020-12-25 DIAGNOSIS — N3281 Overactive bladder: Secondary | ICD-10-CM

## 2021-01-14 DIAGNOSIS — Z1231 Encounter for screening mammogram for malignant neoplasm of breast: Secondary | ICD-10-CM | POA: Diagnosis not present

## 2021-01-22 ENCOUNTER — Other Ambulatory Visit: Payer: Self-pay

## 2021-01-22 ENCOUNTER — Ambulatory Visit (INDEPENDENT_AMBULATORY_CARE_PROVIDER_SITE_OTHER): Payer: Medicare Other | Admitting: Urology

## 2021-01-22 DIAGNOSIS — N3281 Overactive bladder: Secondary | ICD-10-CM

## 2021-01-22 DIAGNOSIS — N390 Urinary tract infection, site not specified: Secondary | ICD-10-CM

## 2021-01-22 NOTE — Progress Notes (Signed)
PATIENT RESCHEDULED

## 2021-02-19 ENCOUNTER — Ambulatory Visit (INDEPENDENT_AMBULATORY_CARE_PROVIDER_SITE_OTHER): Payer: Medicare Other | Admitting: Urology

## 2021-02-19 ENCOUNTER — Other Ambulatory Visit: Payer: Self-pay

## 2021-02-19 DIAGNOSIS — N390 Urinary tract infection, site not specified: Secondary | ICD-10-CM

## 2021-02-19 DIAGNOSIS — N3281 Overactive bladder: Secondary | ICD-10-CM

## 2021-02-19 NOTE — Progress Notes (Signed)
Patient rescheduled

## 2021-02-25 DIAGNOSIS — E7849 Other hyperlipidemia: Secondary | ICD-10-CM | POA: Diagnosis not present

## 2021-02-25 DIAGNOSIS — M05711 Rheumatoid arthritis with rheumatoid factor of right shoulder without organ or systems involvement: Secondary | ICD-10-CM | POA: Diagnosis not present

## 2021-02-25 DIAGNOSIS — L430 Hypertrophic lichen planus: Secondary | ICD-10-CM | POA: Diagnosis not present

## 2021-02-25 DIAGNOSIS — E8881 Metabolic syndrome: Secondary | ICD-10-CM | POA: Diagnosis not present

## 2021-02-25 DIAGNOSIS — I10 Essential (primary) hypertension: Secondary | ICD-10-CM | POA: Diagnosis not present

## 2021-02-25 DIAGNOSIS — K589 Irritable bowel syndrome without diarrhea: Secondary | ICD-10-CM | POA: Diagnosis not present

## 2021-02-25 DIAGNOSIS — Z79891 Long term (current) use of opiate analgesic: Secondary | ICD-10-CM | POA: Diagnosis not present

## 2021-02-25 DIAGNOSIS — R4582 Worries: Secondary | ICD-10-CM | POA: Diagnosis not present

## 2021-02-26 DIAGNOSIS — M5136 Other intervertebral disc degeneration, lumbar region: Secondary | ICD-10-CM | POA: Insufficient documentation

## 2021-02-26 DIAGNOSIS — M51369 Other intervertebral disc degeneration, lumbar region without mention of lumbar back pain or lower extremity pain: Secondary | ICD-10-CM | POA: Insufficient documentation

## 2021-02-26 DIAGNOSIS — J449 Chronic obstructive pulmonary disease, unspecified: Secondary | ICD-10-CM | POA: Diagnosis not present

## 2021-02-26 DIAGNOSIS — M519 Unspecified thoracic, thoracolumbar and lumbosacral intervertebral disc disorder: Secondary | ICD-10-CM | POA: Diagnosis not present

## 2021-02-26 DIAGNOSIS — Z6837 Body mass index (BMI) 37.0-37.9, adult: Secondary | ICD-10-CM | POA: Diagnosis not present

## 2021-02-26 DIAGNOSIS — M5451 Vertebrogenic low back pain: Secondary | ICD-10-CM | POA: Diagnosis not present

## 2021-04-19 ENCOUNTER — Ambulatory Visit (INDEPENDENT_AMBULATORY_CARE_PROVIDER_SITE_OTHER): Payer: Managed Care, Other (non HMO) | Admitting: Urology

## 2021-04-19 ENCOUNTER — Other Ambulatory Visit: Payer: Self-pay

## 2021-04-19 VITALS — BP 138/83 | HR 112 | Wt 210.0 lb

## 2021-04-19 DIAGNOSIS — N3281 Overactive bladder: Secondary | ICD-10-CM

## 2021-04-19 DIAGNOSIS — R339 Retention of urine, unspecified: Secondary | ICD-10-CM

## 2021-04-19 DIAGNOSIS — N390 Urinary tract infection, site not specified: Secondary | ICD-10-CM

## 2021-04-19 LAB — URINALYSIS, ROUTINE W REFLEX MICROSCOPIC
Bilirubin, UA: NEGATIVE
Nitrite, UA: POSITIVE — AB
Specific Gravity, UA: <1.005 — ABNORMAL LOW (ref 1.005–1.030)
Urobilinogen, Ur: >8 mg/dL — ABNORMAL HIGH (ref 0.2–1.0)
pH, UA: 5 (ref 5.0–7.5)

## 2021-04-19 LAB — BLADDER SCAN AMB NON-IMAGING: Scan Result: 2

## 2021-04-19 MED ORDER — GEMTESA 75 MG PO TABS: 1.0000 | ORAL_TABLET | Freq: Every day | ORAL | 0 refills | Status: DC

## 2021-04-19 MED ORDER — FLUCONAZOLE 150 MG PO TABS: 150.0000 mg | ORAL_TABLET | Freq: Every day | ORAL | 5 refills | Status: DC

## 2021-04-19 MED ORDER — NITROFURANTOIN MACROCRYSTAL 50 MG PO CAPS: 50.0000 mg | ORAL_CAPSULE | Freq: Every day | ORAL | 3 refills | Status: DC

## 2021-04-19 NOTE — Progress Notes (Signed)
Urological Symptom Review  Patient is experiencing the following symptoms: Frequent urination Hard to postpone urination Burning/pain with urination Leakage of urine Stream starts and stops Have to strain to urinate Blood in urine   Review of Systems  Gastrointestinal (upper)  : Negative for upper GI symptoms  Gastrointestinal (lower) : Negative for lower GI symptoms  Constitutional : Negative for symptoms  Skin: Negative for skin symptoms  Eyes: Negative for eye symptoms  Ear/Nose/Throat : Negative for Ear/Nose/Throat symptoms  Hematologic/Lymphatic: Negative for Hematologic/Lymphatic symptoms  Cardiovascular : Negative for cardiovascular symptoms  Respiratory : Negative for respiratory symptoms  Endocrine: Negative for endocrine symptoms  Musculoskeletal: Negative for musculoskeletal symptoms  Neurological: Negative for neurological symptoms  Psychologic: Negative for psychiatric symptoms

## 2021-04-19 NOTE — Progress Notes (Signed)
04/19/2021 11:22 AM   Stephanie Huang [birth year] [id number]  Referring provider: Richardean Chimera, MD [address] [place],  [place] [address]  Followup OAb and recurrent UTI   HPI: [name] is a 56yo here for followup for OAb and recurrent UTI. She is macrobid  qhs.  She has had 3 UTIs this year. She has urinary frequency every 2 hours and daily urgency and urge incontinence. She uses 2-3 pads per day. Nocturia 1-2x. She previously tried mirabegron  which failed to improve her OAb symptoms.    PMH: Past Medical History:  Diagnosis Date   Anxiety    Depression    Fibromyalgia    Fibromyalgia syndrome    GERD (gastroesophageal reflux disease)    Headache(784.0)    Mirgraine- January 2014- last one   History of blood transfusion    after C- Section   Hypertension    IBS (irritable bowel syndrome)    Left knee DJD     Surgical History: Past Surgical History:  Procedure Laterality Date   CESAREAN SECTION     KNEE ARTHROSCOPY Left 09/2011   PARTIAL KNEE ARTHROPLASTY Left 05/10/2012   Procedure: UNICOMPARTMENTAL KNEE;  Surgeon: Nilda Simmer, MD;  Location: [facility];  Service: Orthopedics;  Laterality: Left;   TUBAL LIGATION     VAGINAL HYSTERECTOMY      Home Medications:  Allergies as of 04/19/2021       Reactions   Methotrexate Derivatives Other (See Comments)   Blisters in mouth   Lyrica [pregabalin] Nausea And Vomiting, Swelling        Medication List        Accurate as of April 19, 2021 11:22 AM. If you have any questions, ask your nurse or doctor.          acetaminophen 325 MG tablet Commonly known as: TYLENOL Take 2 tablets (650 mg total) by mouth every 6 (six) hours as needed for pain or fever.   Anoro Ellipta 62.5-25 MCG/ACT Aepb Generic drug: umeclidinium-vilanterol 1 puff daily.   aspirin 81 MG chewable tablet Chew by mouth.   atorvastatin 40 MG tablet Commonly known as: LIPITOR Take by mouth.   bisacodyl 5 MG EC  tablet Commonly known as: DULCOLAX Take 2 tablets every night with dinner until bowel movement.  LAXITIVE.  Restart if two days since last bowel movement   buPROPion 150 MG 24 hr tablet Commonly known as: WELLBUTRIN XL Take 150 mg by mouth daily.   celecoxib 200 MG capsule Commonly known as: CELEBREX Take 1 capsule (200 mg total) by mouth daily.   citalopram 20 MG tablet Commonly known as: CELEXA Take 20 mg by mouth daily.   clobetasol ointment 0.05 % Commonly known as: TEMOVATE Apply topically 2 (two) times daily as needed.   cyclobenzaprine 10 MG tablet Commonly known as: FLEXERIL Take 10 mg by mouth 3 (three) times daily as needed. For muscle spasms   DSS 100 MG Caps 1 tab 2 times a day while on narcotics.  STOOL SOFTENER   DULoxetine 60 MG capsule Commonly known as: CYMBALTA Take 60 mg by mouth 2 (two) times daily.   Enbrel SureClick 50 MG/ML injection Generic drug: etanercept Inject into the skin.   fluconazole 150 MG tablet Commonly known as: DIFLUCAN TAKE ONE TABLET BY MOUTH DAILY   folic acid 1 MG tablet Commonly known as: FOLVITE Take by mouth.   gabapentin 300 MG capsule Commonly known as: NEURONTIN Take 300 mg by mouth  3 (three) times daily as needed.   glipiZIDE 10 MG 24 hr tablet Commonly known as: GLUCOTROL XL Take 10 mg by mouth daily.   HYDROcodone-acetaminophen 5-325 MG tablet Commonly known as: NORCO/VICODIN Take 1 tablet by mouth 4 (four) times daily.   hydrocortisone 2.5 % cream Apply topically daily.   lisinopril-hydrochlorothiazide 20-12.5 MG tablet Commonly known as: ZESTORETIC Take 1 tablet by mouth daily.   metFORMIN 500 MG tablet Commonly known as: GLUCOPHAGE Take by mouth.   metoprolol succinate 25 MG 24 hr tablet Commonly known as: TOPROL-XL Take 25 mg by mouth daily.   nitrofurantoin 50 MG capsule Commonly known as: MACRODANTIN Take 1 capsule (50 mg total) by mouth at bedtime.   norethindrone 5 MG tablet Commonly  known as: AYGESTIN Take 5 mg by mouth daily.   omeprazole 20 MG capsule Commonly known as: PRILOSEC Take 20 mg by mouth daily.   Orencia 50 MG/0.4ML Sosy Generic drug: Abatacept Inject into the skin.   oxyCODONE 5 MG immediate release tablet Commonly known as: Oxy IR/ROXICODONE 1-2 tablets every 4-6 hrs as needed for pain   oxyCODONE 20 mg 12 hr tablet Commonly known as: OXYCONTIN Take 1 tablet (20 mg total) by mouth every 12 (twelve) hours.   Potassium 95 MG Tabs Take by mouth.   tacrolimus 1 MG capsule Commonly known as: PROGRAF Dissolve 1 tablet in 1/2 L water. Swish and spit twice daily.   topiramate 100 MG tablet Commonly known as: TOPAMAX Take 200 mg by mouth daily.   Trulicity 1.5 MG/0.5ML Sopn Generic drug: Dulaglutide Inject 1.5 mg into the skin once a week.   zolpidem 10 MG tablet Commonly known as: AMBIEN Take 10 mg by mouth at bedtime as needed.        Allergies:  Allergies  Allergen Reactions   Methotrexate Derivatives Other (See Comments)    Blisters in mouth   Lyrica [Pregabalin] Nausea And Vomiting and Swelling    Family History: Family History  Problem Relation Age of Onset   Cancer Father    Pneumonia Mother     Social History:  reports that she has been smoking. She has a 30.00 pack-year smoking history. She has never used smokeless tobacco. She reports that she does not drink alcohol and does not use drugs.  ROS: All other review of systems were reviewed and are negative except what is noted above in HPI  Physical Exam: BP 138/83    Pulse (!) 112    Wt 210 lb (95.3 kg)    BMI 36.05 kg/m   Constitutional:  Alert and oriented, No acute distress. HEENT: [place] AT, moist mucus membranes.  Trachea midline, no masses. Cardiovascular: No clubbing, cyanosis, or edema. Respiratory: Normal respiratory effort, no increased work of breathing. GI: Abdomen is soft, nontender, nondistended, no abdominal masses GU: No CVA tenderness.  Lymph: No  cervical or inguinal lymphadenopathy. Skin: No rashes, bruises or suspicious lesions. Neurologic: Grossly intact, no focal deficits, moving all 4 extremities. Psychiatric: Normal mood and affect.  Laboratory Data: Lab Results  Component Value Date   WBC 18.1 (H) 05/11/2012   HGB 13.4 05/11/2012   HCT 39.9 05/11/2012   MCV 91.1 05/11/2012   PLT 276 05/11/2012    Lab Results  Component Value Date   CREATININE 0.50 05/11/2012    No results found for: PSA  No results found for: TESTOSTERONE  No results found for: HGBA1C  Urinalysis    Component Value Date/Time   COLORURINE YELLOW 05/03/2012 0945  APPEARANCEUR Cloudy (A) 05/25/2020 1102   LABSPEC 1.006 05/03/2012 0945   PHURINE 5.0 05/03/2012 0945   GLUCOSEU 3+ (A) 05/25/2020 1102   HGBUR NEGATIVE 05/03/2012 0945   BILIRUBINUR Negative 05/25/2020 1102   KETONESUR NEGATIVE 05/03/2012 0945   PROTEINUR Negative 05/25/2020 1102   PROTEINUR NEGATIVE 05/03/2012 0945   UROBILINOGEN 0.2 05/03/2012 0945   NITRITE Negative 05/25/2020 1102   NITRITE NEGATIVE 05/03/2012 0945   LEUKOCYTESUR Negative 05/25/2020 1102    Lab Results  Component Value Date   LABMICR See below: 05/25/2020   WBCUA 0-5 05/25/2020   LABEPIT >10 (A) 05/25/2020   BACTERIA Moderate (A) 05/25/2020    Pertinent Imaging:  No results found for this or any previous visit.  No results found for this or any previous visit.  No results found for this or any previous visit.  No results found for this or any previous visit.  No results found for this or any previous visit.  No results found for this or any previous visit.  No results found for this or any previous visit.  No results found for this or any previous visit.   Assessment & Plan:    1. Recurrent UTI Macrobid 50mg  qhs - Urinalysis, Routine w reflex microscopic - BLADDER SCAN AMB NON-IMAGING  2. OAB (overactive bladder) -gemtesa 75mg  daily   No follow-ups on file.  Wilkie AyePatrick  Derrik Mceachern, MD  [facility] [place]

## 2021-04-19 NOTE — Progress Notes (Signed)
post void residual =2ml 

## 2021-04-29 ENCOUNTER — Encounter: Payer: Self-pay | Admitting: Urology

## 2021-04-29 NOTE — Patient Instructions (Signed)

## 2021-05-02 ENCOUNTER — Telehealth: Payer: Self-pay

## 2021-05-02 NOTE — Telephone Encounter (Signed)
Patient complaining of recurrent uti symptoms. Patient placed on PA schedule for in the morning for evaluation.

## 2021-05-03 ENCOUNTER — Ambulatory Visit (INDEPENDENT_AMBULATORY_CARE_PROVIDER_SITE_OTHER): Payer: Managed Care, Other (non HMO) | Admitting: Physician Assistant

## 2021-05-03 ENCOUNTER — Encounter: Payer: Self-pay | Admitting: Physician Assistant

## 2021-05-03 ENCOUNTER — Other Ambulatory Visit: Payer: Self-pay

## 2021-05-03 VITALS — BP 119/79 | HR 79 | Wt 210.0 lb

## 2021-05-03 DIAGNOSIS — N390 Urinary tract infection, site not specified: Secondary | ICD-10-CM | POA: Diagnosis not present

## 2021-05-03 DIAGNOSIS — N3281 Overactive bladder: Secondary | ICD-10-CM | POA: Diagnosis not present

## 2021-05-03 LAB — MICROSCOPIC EXAMINATION
Renal Epithel, UA: NONE SEEN /hpf
WBC, UA: >30 /hpf — AB (ref 0–5)

## 2021-05-03 LAB — URINALYSIS, ROUTINE W REFLEX MICROSCOPIC
Bilirubin, UA: NEGATIVE
Ketones, UA: NEGATIVE
Nitrite, UA: POSITIVE — AB
Specific Gravity, UA: 1.01 (ref 1.005–1.030)
Urobilinogen, Ur: 2 mg/dL — ABNORMAL HIGH (ref 0.2–1.0)
pH, UA: 5 (ref 5.0–7.5)

## 2021-05-03 LAB — BLADDER SCAN AMB NON-IMAGING: Scan Result: 22

## 2021-05-03 MED ORDER — SULFAMETHOXAZOLE-TRIMETHOPRIM 800-160 MG PO TABS
1.0000 | ORAL_TABLET | Freq: Two times a day (BID) | ORAL | 0 refills | Status: DC
Start: 2021-05-03 — End: 2022-09-23

## 2021-05-03 NOTE — Progress Notes (Signed)
Assessment: 1. Urinary tract infection without hematuria, site unspecified   2. Recurrent UTI   3. OAB (overactive bladder)     Plan: Today's urine sent for culture.  Patient is advised to hold at bedtime Macrobid and will start Bactrim DS twice daily for 5 days until culture results available.  We will adjust treatment if indicated.  Following treatment for active UTI, she will resume the at bedtime Macrobid.  Continue Gemtesa.  Keep follow-up in 2 to 3 weeks for recheck urinalysis. Pt will be out of town next week and will need to use an out of town pharmacy if Rx changes per culture.  Chief Complaint: Urinary frequency and burning  HPI: Stephanie Huang is a 56 y.o. female who presents for continued evaluation of OAB and frequent UTIs. At her visit 2 weeks ago, she was placed on Gemtesa 75 mg on 04/19/2021 and has continued Macrobid 50 mg at bedtime. Today, the patient continues to complain of urinary frequency, burning, dysuria.  She denies gross hematuria and reports no fever, chills, vomiting. Urinalysis = greater than 30 WBCs, 0-2 RBCs, few bacteria, nitrite positive PVR = 22 mL   04/19/21 [name] is a 56yo here for followup for OAb and recurrent UTI. She is macrobid 50mg  qhs.  She has had 3 UTIs this year. She has urinary frequency every 2 hours and daily urgency and urge incontinence. She uses 2-3 pads per day. Nocturia 1-2x. She previously tried mirabegron 25mg  which failed to improve her OAb symptoms.   Portions of the above documentation were copied from a prior visit for review purposes only.  Allergies: Allergies  Allergen Reactions   Methotrexate Derivatives Other (See Comments)    Blisters in mouth   Lyrica [Pregabalin] Nausea And Vomiting and Swelling    PMH: Past Medical History:  Diagnosis Date   Anxiety    Depression    Fibromyalgia    Fibromyalgia syndrome    GERD (gastroesophageal reflux disease)    Headache(784.0)    Mirgraine- January 2014- last  one   History of blood transfusion    after C- Section   Hypertension    IBS (irritable bowel syndrome)    Left knee DJD     PSH: Past Surgical History:  Procedure Laterality Date   CESAREAN SECTION     KNEE ARTHROSCOPY Left 09/2011   PARTIAL KNEE ARTHROPLASTY Left 05/10/2012   Procedure: UNICOMPARTMENTAL KNEE;  Surgeon: Lorn Junes, MD;  Location: [facility];  Service: Orthopedics;  Laterality: Left;   TUBAL LIGATION     VAGINAL HYSTERECTOMY      SH: Social History   Tobacco Use   Smoking status: Every Day    Packs/day: 1.00    Years: 30.00    Pack years: 30.00    Types: Cigarettes    Last attempt to quit: 03/05/2012    Years since quitting: 9.1   Smokeless tobacco: Never  Substance Use Topics   Alcohol use: No   Drug use: No    ROS: Constitutional:  Negative for fever, chills, weight loss CV: Negative for chest pain, previous MI, hypertension Respiratory:  Negative for shortness of breath, wheezing, sleep apnea, frequent cough GI:  Negative for nausea, vomiting, bloody stool, GERD  PE: BP 119/79 (BP Location: Right Arm)    Pulse 79    Wt 210 lb (95.3 kg)    BMI 36.05 kg/m  GENERAL APPEARANCE:  Well appearing, well developed, well nourished, NAD HEENT:  Atraumatic, normocephalic NECK:  Supple. Trachea midline ABDOMEN:  Soft, mild suprapubic tenderness, no masses EXTREMITIES:  Moves all extremities well, without clubbing, cyanosis, or edema NEUROLOGIC:  Alert and oriented x 3, normal gait, CN II-XII grossly intact MENTAL STATUS:  appropriate BACK:  Non-tender to palpation, No CVAT SKIN:  Warm, dry, and intact   Results: Laboratory Data: Lab Results  Component Value Date   WBC 18.1 (H) 05/11/2012   HGB 13.4 05/11/2012   HCT 39.9 05/11/2012   MCV 91.1 05/11/2012   PLT 276 05/11/2012    Lab Results  Component Value Date   CREATININE 0.50 05/11/2012    No results found for: HGBA1C  Urinalysis    Component Value Date/Time   COLORURINE YELLOW  05/03/2012 0945   APPEARANCEUR Cloudy (A) 04/19/2021 1326   LABSPEC 1.006 05/03/2012 0945   PHURINE 5.0 05/03/2012 0945   GLUCOSEU 1+ (A) 04/19/2021 1326   HGBUR NEGATIVE 05/03/2012 0945   BILIRUBINUR Negative 04/19/2021 1326   KETONESUR NEGATIVE 05/03/2012 0945   PROTEINUR 3+ (A) 04/19/2021 1326   PROTEINUR NEGATIVE 05/03/2012 0945   UROBILINOGEN 0.2 05/03/2012 0945   NITRITE Positive (A) 04/19/2021 1326   NITRITE NEGATIVE 05/03/2012 0945   LEUKOCYTESUR 3+ (A) 04/19/2021 1326    Lab Results  Component Value Date   LABMICR Comment 04/19/2021   WBCUA 0-5 05/25/2020   LABEPIT >10 (A) 05/25/2020   BACTERIA Moderate (A) 05/25/2020    Pertinent Imaging:  No results found for this or any previous visit.  No results found for this or any previous visit.  No results found for this or any previous visit.  No results found for this or any previous visit.  No results found for this or any previous visit.  No results found for this or any previous visit.  No results found for this or any previous visit.  No results found for this or any previous visit.  No results found for this or any previous visit (from the past 24 hour(s)).

## 2021-05-03 NOTE — Progress Notes (Signed)
post void residual=22 

## 2021-05-06 ENCOUNTER — Other Ambulatory Visit: Payer: Self-pay | Admitting: Physician Assistant

## 2021-05-06 LAB — URINE CULTURE

## 2021-05-06 MED ORDER — TRIMETHOPRIM 100 MG PO TABS: 100.0000 mg | ORAL_TABLET | Freq: Every day | ORAL | 3 refills | Status: DC

## 2021-05-06 NOTE — Progress Notes (Signed)
Proteus positive cx. Resistant to Doxy and Nitrofurantoin. Will complete Bactrim DS Rx, then start new HS Rx for Trimethoprim

## 2021-05-15 ENCOUNTER — Other Ambulatory Visit: Payer: Self-pay

## 2021-05-15 ENCOUNTER — Ambulatory Visit (INDEPENDENT_AMBULATORY_CARE_PROVIDER_SITE_OTHER): Payer: Managed Care, Other (non HMO) | Admitting: Urology

## 2021-05-15 ENCOUNTER — Encounter: Payer: Self-pay | Admitting: Urology

## 2021-05-15 VITALS — BP 113/76 | HR 103 | Ht 64.0 in | Wt 210.0 lb

## 2021-05-15 DIAGNOSIS — N390 Urinary tract infection, site not specified: Secondary | ICD-10-CM | POA: Diagnosis not present

## 2021-05-15 DIAGNOSIS — N3281 Overactive bladder: Secondary | ICD-10-CM | POA: Diagnosis not present

## 2021-05-15 DIAGNOSIS — N3021 Other chronic cystitis with hematuria: Secondary | ICD-10-CM | POA: Diagnosis not present

## 2021-05-15 LAB — MICROSCOPIC EXAMINATION
Epithelial Cells (non renal): >10 /hpf — AB (ref 0–10)
Renal Epithel, UA: NONE SEEN /hpf

## 2021-05-15 LAB — URINALYSIS, ROUTINE W REFLEX MICROSCOPIC
Bilirubin, UA: NEGATIVE
Ketones, UA: NEGATIVE
Leukocytes,UA: NEGATIVE
Nitrite, UA: POSITIVE — AB
Specific Gravity, UA: 1.01 (ref 1.005–1.030)
Urobilinogen, Ur: 1 mg/dL (ref 0.2–1.0)
pH, UA: 5.5 (ref 5.0–7.5)

## 2021-05-15 MED ORDER — TRIMETHOPRIM 100 MG PO TABS: 100.0000 mg | ORAL_TABLET | Freq: Every day | ORAL | 3 refills | Status: DC

## 2021-05-15 NOTE — Progress Notes (Signed)
05/15/2021 2:42 PM   Stephanie Huang Aug 30, 1965 [id number]  Referring provider: Richardean Chimera, MD [address] [place],  [place] [address]  Followup recurrent UTiI and OAB   HPI: Stephanie Huang is a 55yo here for followup for recurrent UTI and OAB. She was treated for a UTI last visit with bactrim. She notes no improvement in her urinary urgency and frequency since starting gemtesa. She has also failed mirabegron. She also has a feeling of incomplete emptying and intermittent pelvic pressure. No history of nephrolithiasis. No other complaints today    PMH: Past Medical History:  Diagnosis Date   Anxiety    Depression    Fibromyalgia    Fibromyalgia syndrome    GERD (gastroesophageal reflux disease)    Headache(784.0)    Mirgraine- January 2014- last one   History of blood transfusion    after C- Section   Hypertension    IBS (irritable bowel syndrome)    Left knee DJD     Surgical History: Past Surgical History:  Procedure Laterality Date   CESAREAN SECTION     KNEE ARTHROSCOPY Left 09/2011   PARTIAL KNEE ARTHROPLASTY Left 05/10/2012   Procedure: UNICOMPARTMENTAL KNEE;  Surgeon: Nilda Simmer, MD;  Location: [facility];  Service: Orthopedics;  Laterality: Left;   TUBAL LIGATION     VAGINAL HYSTERECTOMY      Home Medications:  Allergies as of 05/15/2021       Reactions   Methotrexate Derivatives Other (See Comments)   Blisters in mouth   Lyrica [pregabalin] Nausea And Vomiting, Swelling        Medication List        Accurate as of May 15, 2021  2:42 PM. If you have any questions, ask your nurse or doctor.          acetaminophen 325 MG tablet Commonly known as: TYLENOL Take 2 tablets (650 mg total) by mouth every 6 (six) hours as needed for pain or fever.   Anoro Ellipta 62.5-25 MCG/ACT Aepb Generic drug: umeclidinium-vilanterol 1 puff daily.   aspirin 81 MG chewable tablet Chew by mouth.   atorvastatin 40 MG tablet Commonly known as: LIPITOR Take  by mouth.   bisacodyl 5 MG EC tablet Commonly known as: DULCOLAX Take 2 tablets every night with dinner until bowel movement.  LAXITIVE.  Restart if two days since last bowel movement   buPROPion 150 MG 24 hr tablet Commonly known as: WELLBUTRIN XL Take 150 mg by mouth daily.   celecoxib 200 MG capsule Commonly known as: CELEBREX Take 1 capsule (200 mg total) by mouth daily.   citalopram 20 MG tablet Commonly known as: CELEXA Take 20 mg by mouth daily.   clobetasol ointment 0.05 % Commonly known as: TEMOVATE Apply topically 2 (two) times daily as needed.   cyclobenzaprine 10 MG tablet Commonly known as: FLEXERIL Take 10 mg by mouth 3 (three) times daily as needed. For muscle spasms   DSS 100 MG Caps 1 tab 2 times a day while on narcotics.  STOOL SOFTENER   DULoxetine 60 MG capsule Commonly known as: CYMBALTA Take 60 mg by mouth 2 (two) times daily.   Enbrel SureClick 50 MG/ML injection Generic drug: etanercept Inject into the skin.   fluconazole 150 MG tablet Commonly known as: DIFLUCAN Take 1 tablet (150 mg total) by mouth daily.   folic acid 1 MG tablet Commonly known as: FOLVITE Take by mouth.   gabapentin 300 MG capsule Commonly known as:  NEURONTIN Take 300 mg by mouth 3 (three) times daily as needed.   Gemtesa 75 MG Tabs Generic drug: Vibegron Take 1 capsule by mouth daily.   glipiZIDE 10 MG 24 hr tablet Commonly known as: GLUCOTROL XL Take 10 mg by mouth daily.   HYDROcodone-acetaminophen 5-325 MG tablet Commonly known as: NORCO/VICODIN Take 1 tablet by mouth 4 (four) times daily.   hydrocortisone 2.5 % cream Apply topically daily.   lisinopril-hydrochlorothiazide 20-12.5 MG tablet Commonly known as: ZESTORETIC Take 1 tablet by mouth daily.   metFORMIN 500 MG tablet Commonly known as: GLUCOPHAGE Take by mouth.   metoprolol succinate 25 MG 24 hr tablet Commonly known as: TOPROL-XL Take 25 mg by mouth daily.   norethindrone 5 MG  tablet Commonly known as: AYGESTIN Take 5 mg by mouth daily.   omeprazole 20 MG capsule Commonly known as: PRILOSEC Take 20 mg by mouth daily.   Orencia 50 MG/0.4ML Sosy Generic drug: Abatacept Inject into the skin.   oxyCODONE 5 MG immediate release tablet Commonly known as: Oxy IR/ROXICODONE 1-2 tablets every 4-6 hrs as needed for pain   oxyCODONE 20 mg 12 hr tablet Commonly known as: OXYCONTIN Take 1 tablet (20 mg total) by mouth every 12 (twelve) hours.   Potassium 95 MG Tabs Take by mouth.   sulfamethoxazole-trimethoprim 800-160 MG tablet Commonly known as: BACTRIM DS Take 1 tablet by mouth 2 (two) times daily.   tacrolimus 1 MG capsule Commonly known as: PROGRAF Dissolve 1 tablet in 1/2 L water. Swish and spit twice daily.   topiramate 100 MG tablet Commonly known as: TOPAMAX Take 200 mg by mouth daily.   trimethoprim 100 MG tablet Commonly known as: TRIMPEX Take 1 tablet (100 mg total) by mouth at bedtime.   Trulicity 1.5 0000000 Sopn Generic drug: Dulaglutide Inject 1.5 mg into the skin once a week.   zolpidem 10 MG tablet Commonly known as: AMBIEN Take 10 mg by mouth at bedtime as needed.        Allergies:  Allergies  Allergen Reactions   Methotrexate Derivatives Other (See Comments)    Blisters in mouth   Lyrica [Pregabalin] Nausea And Vomiting and Swelling    Family History: Family History  Problem Relation Age of Onset   Cancer Father    Pneumonia Mother     Social History:  reports that she has been smoking. She has a 30.00 pack-year smoking history. She has never used smokeless tobacco. She reports that she does not drink alcohol and does not use drugs.  ROS: All other review of systems were reviewed and are negative except what is noted above in HPI  Physical Exam: BP 113/76    Pulse (!) 103    Ht 5\' 4"  (1.626 m)    Wt 210 lb (95.3 kg)    BMI 36.05 kg/m   Constitutional:  Alert and oriented, No acute distress. HEENT: [place] AT,  moist mucus membranes.  Trachea midline, no masses. Cardiovascular: No clubbing, cyanosis, or edema. Respiratory: Normal respiratory effort, no increased work of breathing. GI: Abdomen is soft, nontender, nondistended, no abdominal masses GU: No CVA tenderness.  Lymph: No cervical or inguinal lymphadenopathy. Skin: No rashes, bruises or suspicious lesions. Neurologic: Grossly intact, no focal deficits, moving all 4 extremities. Psychiatric: Normal mood and affect.  Laboratory Data: Lab Results  Component Value Date   WBC 18.1 (H) 05/11/2012   HGB 13.4 05/11/2012   HCT 39.9 05/11/2012   MCV 91.1 05/11/2012   PLT 276 05/11/2012  Lab Results  Component Value Date   CREATININE 0.50 05/11/2012    No results found for: PSA  No results found for: TESTOSTERONE  No results found for: HGBA1C  Urinalysis    Component Value Date/Time   COLORURINE YELLOW 05/03/2012 0945   APPEARANCEUR Cloudy (A) 05/03/2021 1035   LABSPEC 1.006 05/03/2012 0945   PHURINE 5.0 05/03/2012 0945   GLUCOSEU Trace (A) 05/03/2021 1035   HGBUR NEGATIVE 05/03/2012 0945   BILIRUBINUR Negative 05/03/2021 1035   KETONESUR NEGATIVE 05/03/2012 0945   PROTEINUR 1+ (A) 05/03/2021 1035   PROTEINUR NEGATIVE 05/03/2012 0945   UROBILINOGEN 0.2 05/03/2012 0945   NITRITE Positive (A) 05/03/2021 1035   NITRITE NEGATIVE 05/03/2012 0945   LEUKOCYTESUR 1+ (A) 05/03/2021 1035    Lab Results  Component Value Date   LABMICR See below: 05/03/2021   WBCUA >30 (A) 05/03/2021   LABEPIT 0-10 05/03/2021   BACTERIA Few (A) 05/03/2021    Pertinent Imaging:  No results found for this or any previous visit.  No results found for this or any previous visit.  No results found for this or any previous visit.  No results found for this or any previous visit.  No results found for this or any previous visit.  No results found for this or any previous visit.  No results found for this or any previous visit.  No  results found for this or any previous visit.   Assessment & Plan:    1. Recurrent UTI -We will start trimethoprim 100mg  qhs - Urinalysis, Routine w reflex microscopic -CT hematuria protocol  2. OAB (overactive bladder) -Continue gemtesa 75mg  daily - Urinalysis, Routine w reflex microscopic    Return in about 2 weeks (around 05/29/2021) for ct hematuria.  Nicolette Bang, MD  [facility] [place]

## 2021-05-15 NOTE — Patient Instructions (Signed)
Urinary Tract Infection, Adult A urinary tract infection (UTI) is an infection of any part of the urinary tract. The urinary tract includes the kidneys, ureters, bladder, and urethra. These organs make, store, and get rid of urine in the body. An upper UTI affects the ureters and kidneys. A lower UTI affects the bladder and urethra. What are the causes? Most urinary tract infections are caused by bacteria in your genital area around your urethra, where urine leaves your body. These bacteria grow and cause inflammation of your urinary tract. What increases the risk? You are more likely to develop this condition if: You have a urinary catheter that stays in place. You are not able to control when you urinate or have a bowel movement (incontinence). You are female and you: Use a spermicide or diaphragm for birth control. Have low estrogen levels. Are pregnant. You have certain genes that increase your risk. You are sexually active. You take antibiotic medicines. You have a condition that causes your flow of urine to slow down, such as: An enlarged prostate, if you are female. Blockage in your urethra. A kidney stone. A nerve condition that affects your bladder control (neurogenic bladder). Not getting enough to drink, or not urinating often. You have certain medical conditions, such as: Diabetes. A weak disease-fighting system (immunesystem). Sickle cell disease. Gout. Spinal cord injury. What are the signs or symptoms? Symptoms of this condition include: Needing to urinate right away (urgency). Frequent urination. This may include small amounts of urine each time you urinate. Pain or burning with urination. Blood in the urine. Urine that smells bad or unusual. Trouble urinating. Cloudy urine. Vaginal discharge, if you are female. Pain in the abdomen or the lower back. You may also have: Vomiting or a decreased appetite. Confusion. Irritability or tiredness. A fever or  chills. Diarrhea. The first symptom in older adults may be confusion. In some cases, they may not have any symptoms until the infection has worsened. How is this diagnosed? This condition is diagnosed based on your medical history and a physical exam. You may also have other tests, including: Urine tests. Blood tests. Tests for STIs (sexually transmitted infections). If you have had more than one UTI, a cystoscopy or imaging studies may be done to determine the cause of the infections. How is this treated? Treatment for this condition includes: Antibiotic medicine. Over-the-counter medicines to treat discomfort. Drinking enough water to stay hydrated. If you have frequent infections or have other conditions such as a kidney stone, you may need to see a health care provider who specializes in the urinary tract (urologist). In rare cases, urinary tract infections can cause sepsis. Sepsis is a life-threatening condition that occurs when the body responds to an infection. Sepsis is treated in the hospital with IV antibiotics, fluids, and other medicines. Follow these instructions at home: Medicines Take over-the-counter and prescription medicines only as told by your health care provider. If you were prescribed an antibiotic medicine, take it as told by your health care provider. Do not stop using the antibiotic even if you start to feel better. General instructions Make sure you: Empty your bladder often and completely. Do not hold urine for long periods of time. Empty your bladder after sex. Wipe from front to back after urinating or having a bowel movement if you are female. Use each tissue only one time when you wipe. Drink enough fluid to keep your urine pale yellow. Keep all follow-up visits. This is important. Contact a health care provider   if: Your symptoms do not get better after 1-2 days. Your symptoms go away and then return. Get help right away if: You have severe pain in your  back or your lower abdomen. You have a fever or chills. You have nausea or vomiting. Summary A urinary tract infection (UTI) is an infection of any part of the urinary tract, which includes the kidneys, ureters, bladder, and urethra. Most urinary tract infections are caused by bacteria in your genital area. Treatment for this condition often includes antibiotic medicines. If you were prescribed an antibiotic medicine, take it as told by your health care provider. Do not stop using the antibiotic even if you start to feel better. Keep all follow-up visits. This is important. This information is not intended to replace advice given to you by your health care provider. Make sure you discuss any questions you have with your health care provider. Document Revised: 10/21/2019 Document Reviewed: 10/21/2019 Elsevier Patient Education  2022 [employer]  

## 2021-05-16 LAB — BASIC METABOLIC PANEL
BUN/Creatinine Ratio: 8 — ABNORMAL LOW (ref 9–23)
BUN: 6 mg/dL (ref 6–24)
CO2: 26 mmol/L (ref 20–29)
Calcium: 9.3 mg/dL (ref 8.7–10.2)
Chloride: 103 mmol/L (ref 96–106)
Creatinine, Ser: 0.75 mg/dL (ref 0.57–1.00)
Glucose: 122 mg/dL — ABNORMAL HIGH (ref 70–99)
Potassium: 3.6 mmol/L (ref 3.5–5.2)
Sodium: 142 mmol/L (ref 134–144)
eGFR: 94 mL/min/{1.73_m2} (ref >59)

## 2021-05-17 ENCOUNTER — Ambulatory Visit: Payer: Medicare Other | Admitting: Physician Assistant

## 2021-05-22 ENCOUNTER — Other Ambulatory Visit: Payer: Self-pay

## 2021-05-22 MED ORDER — GEMTESA 75 MG PO TABS: 1.0000 | ORAL_TABLET | Freq: Every day | ORAL | 0 refills | Status: DC

## 2021-05-22 MED ORDER — GEMTESA 75 MG PO TABS: 1.0000 | ORAL_TABLET | Freq: Every day | ORAL | 11 refills | Status: DC

## 2021-05-22 NOTE — Progress Notes (Signed)
Received call from patient that she would like for Gemtesa to be sent to pharmacy for Rx. Gemtesa sent to [facility].  ?

## 2021-05-27 DIAGNOSIS — E7849 Other hyperlipidemia: Secondary | ICD-10-CM | POA: Diagnosis not present

## 2021-05-27 DIAGNOSIS — I10 Essential (primary) hypertension: Secondary | ICD-10-CM | POA: Diagnosis not present

## 2021-05-27 DIAGNOSIS — J449 Chronic obstructive pulmonary disease, unspecified: Secondary | ICD-10-CM | POA: Diagnosis not present

## 2021-05-27 DIAGNOSIS — R4582 Worries: Secondary | ICD-10-CM | POA: Diagnosis not present

## 2021-05-27 DIAGNOSIS — L430 Hypertrophic lichen planus: Secondary | ICD-10-CM | POA: Diagnosis not present

## 2021-05-27 DIAGNOSIS — E1169 Type 2 diabetes mellitus with other specified complication: Secondary | ICD-10-CM | POA: Diagnosis not present

## 2021-05-27 DIAGNOSIS — K589 Irritable bowel syndrome without diarrhea: Secondary | ICD-10-CM | POA: Diagnosis not present

## 2021-05-27 DIAGNOSIS — M05711 Rheumatoid arthritis with rheumatoid factor of right shoulder without organ or systems involvement: Secondary | ICD-10-CM | POA: Diagnosis not present

## 2021-06-03 ENCOUNTER — Ambulatory Visit (HOSPITAL_COMMUNITY): Payer: Managed Care, Other (non HMO)

## 2021-06-06 ENCOUNTER — Ambulatory Visit (HOSPITAL_COMMUNITY)
Admission: RE | Admit: 2021-06-06 | Discharge: 2021-06-06 | Disposition: A | Discharge status: Home / Self Care | Payer: Managed Care, Other (non HMO) | Source: Ambulatory Visit | Attending: Urology | Admitting: Urology

## 2021-06-06 ENCOUNTER — Other Ambulatory Visit: Payer: Self-pay

## 2021-06-06 DIAGNOSIS — N3021 Other chronic cystitis with hematuria: Secondary | ICD-10-CM | POA: Diagnosis not present

## 2021-06-06 DIAGNOSIS — R319 Hematuria, unspecified: Secondary | ICD-10-CM | POA: Diagnosis not present

## 2021-06-06 DIAGNOSIS — N2889 Other specified disorders of kidney and ureter: Secondary | ICD-10-CM | POA: Diagnosis not present

## 2021-06-06 DIAGNOSIS — N3289 Other specified disorders of bladder: Secondary | ICD-10-CM | POA: Diagnosis not present

## 2021-06-06 MED ORDER — IOHEXOL 300 MG/ML  SOLN
100.0000 mL | Freq: Once | INTRAMUSCULAR | Status: AC | PRN
  Administered 2021-06-06: 100 mL via INTRAVENOUS

## 2021-06-10 ENCOUNTER — Ambulatory Visit (INDEPENDENT_AMBULATORY_CARE_PROVIDER_SITE_OTHER): Payer: Managed Care, Other (non HMO) | Admitting: Urology

## 2021-06-10 ENCOUNTER — Encounter: Payer: Self-pay | Admitting: Urology

## 2021-06-10 ENCOUNTER — Other Ambulatory Visit: Payer: Self-pay

## 2021-06-10 VITALS — BP 89/60 | HR 99

## 2021-06-10 DIAGNOSIS — N3021 Other chronic cystitis with hematuria: Secondary | ICD-10-CM | POA: Diagnosis not present

## 2021-06-10 DIAGNOSIS — N3281 Overactive bladder: Secondary | ICD-10-CM

## 2021-06-10 DIAGNOSIS — N390 Urinary tract infection, site not specified: Secondary | ICD-10-CM

## 2021-06-10 LAB — URINALYSIS, ROUTINE W REFLEX MICROSCOPIC
Bilirubin, UA: NEGATIVE
Glucose, UA: NEGATIVE
Ketones, UA: NEGATIVE
Leukocytes,UA: NEGATIVE
Nitrite, UA: NEGATIVE
Protein,UA: NEGATIVE
Specific Gravity, UA: 1.015 (ref 1.005–1.030)
Urobilinogen, Ur: 0.2 mg/dL (ref 0.2–1.0)
pH, UA: 7 (ref 5.0–7.5)

## 2021-06-10 LAB — MICROSCOPIC EXAMINATION: Renal Epithel, UA: NONE SEEN /hpf

## 2021-06-10 MED ORDER — SOLIFENACIN SUCCINATE 10 MG PO TABS: 10.0000 mg | ORAL_TABLET | Freq: Every day | ORAL | 1 refills | Status: DC

## 2021-06-10 NOTE — Progress Notes (Signed)
06/10/2021 2:36 PM   Stephanie Huang December 16, 1965 [id number]  Referring provider: Richardean Chimera, MD [address] [place],  [place] [address]  Followup recurrent UTI and OAB   HPI: [name] is a 56yo here for followup for recurrent UTi and OAB. Gemtesa and mirabegron failed to improve her urinary urgency, urinary frequency or her pelvic pain/pressure. No strianing to urinate. No dysuria or hematuria. Last urine culture grew proteus. CT hematuria protocol 06/06/2021 showed no calculi and no GU abnormalities. No other complaints today   PMH: Past Medical History:  Diagnosis Date   Anxiety    Depression    Fibromyalgia    Fibromyalgia syndrome    GERD (gastroesophageal reflux disease)    Headache(784.0)    Mirgraine- January 2014- last one   History of blood transfusion    after C- Section   Hypertension    IBS (irritable bowel syndrome)    Left knee DJD     Surgical History: Past Surgical History:  Procedure Laterality Date   CESAREAN SECTION     KNEE ARTHROSCOPY Left 09/2011   PARTIAL KNEE ARTHROPLASTY Left 05/10/2012   Procedure: UNICOMPARTMENTAL KNEE;  Surgeon: Nilda Simmer, MD;  Location: [facility] OR;  Service: Orthopedics;  Laterality: Left;   TUBAL LIGATION     VAGINAL HYSTERECTOMY      Home Medications:  Allergies as of 06/10/2021       Reactions   Methotrexate Derivatives Other (See Comments)   Blisters in mouth   Lyrica [pregabalin] Nausea And Vomiting, Swelling        Medication List        Accurate as of June 10, 2021  2:36 PM. If you have any questions, ask your nurse or doctor.          STOP taking these medications    Gemtesa 75 MG Tabs Generic drug: Vibegron Stopped by: Wilkie Aye, MD       TAKE these medications    acetaminophen 325 MG tablet Commonly known as: TYLENOL Take 2 tablets (650 mg total) by mouth every 6 (six) hours as needed for pain or fever.   Anoro Ellipta 62.5-25 MCG/ACT Aepb Generic drug:  umeclidinium-vilanterol 1 puff daily.   aspirin 81 MG chewable tablet Chew by mouth.   atorvastatin 40 MG tablet Commonly known as: LIPITOR Take by mouth.   bisacodyl 5 MG EC tablet Commonly known as: DULCOLAX Take 2 tablets every night with dinner until bowel movement.  LAXITIVE.  Restart if two days since last bowel movement   buPROPion 150 MG 24 hr tablet Commonly known as: WELLBUTRIN XL Take 150 mg by mouth daily.   celecoxib 200 MG capsule Commonly known as: CELEBREX Take 1 capsule (200 mg total) by mouth daily.   citalopram 20 MG tablet Commonly known as: CELEXA Take 20 mg by mouth daily.   clobetasol ointment 0.05 % Commonly known as: TEMOVATE Apply topically 2 (two) times daily as needed.   cyclobenzaprine 10 MG tablet Commonly known as: FLEXERIL Take 10 mg by mouth 3 (three) times daily as needed. For muscle spasms   diclofenac 75 MG EC tablet Commonly known as: VOLTAREN Take 75 mg by mouth 2 (two) times daily.   DSS 100 MG Caps 1 tab 2 times a day while on narcotics.  STOOL SOFTENER   DULoxetine 60 MG capsule Commonly known as: CYMBALTA Take 60 mg by mouth 2 (two) times daily.   Enbrel SureClick 50 MG/ML injection Generic drug: etanercept Inject  into the skin.   ezetimibe 10 MG tablet Commonly known as: ZETIA Take 10 mg by mouth daily.   fluconazole 150 MG tablet Commonly known as: DIFLUCAN Take 1 tablet (150 mg total) by mouth daily.   folic acid 1 MG tablet Commonly known as: FOLVITE Take by mouth.   gabapentin 300 MG capsule Commonly known as: NEURONTIN Take 300 mg by mouth 3 (three) times daily as needed.   gabapentin 600 MG tablet Commonly known as: NEURONTIN Take 600 mg by mouth 3 (three) times daily.   glipiZIDE 10 MG 24 hr tablet Commonly known as: GLUCOTROL XL Take 10 mg by mouth daily.   HYDROcodone-acetaminophen 5-325 MG tablet Commonly known as: NORCO/VICODIN Take 1 tablet by mouth 4 (four) times daily.    hydrocortisone 2.5 % cream Apply topically daily.   Jardiance 25 MG Tabs tablet Generic drug: empagliflozin Take 25 mg by mouth daily.   lisinopril-hydrochlorothiazide 20-12.5 MG tablet Commonly known as: ZESTORETIC Take 1 tablet by mouth daily.   metFORMIN 500 MG 24 hr tablet Commonly known as: GLUCOPHAGE-XR Take 1,000 mg by mouth daily.   metFORMIN 500 MG tablet Commonly known as: GLUCOPHAGE Take by mouth.   metoprolol succinate 25 MG 24 hr tablet Commonly known as: TOPROL-XL Take 25 mg by mouth daily.   Mounjaro 7.5 MG/0.5ML Pen Generic drug: tirzepatide SMARTSIG:0.5 Milliliter(s) SUB-Q Once a Week   norethindrone 5 MG tablet Commonly known as: AYGESTIN Take 5 mg by mouth daily.   omeprazole 20 MG capsule Commonly known as: PRILOSEC Take 20 mg by mouth daily.   Orencia ClickJect 125 MG/ML Soaj Generic drug: Abatacept Inject 1ml   Orencia 50 MG/0.4ML Sosy Generic drug: Abatacept Inject into the skin.   oxyCODONE 5 MG immediate release tablet Commonly known as: Oxy IR/ROXICODONE 1-2 tablets every 4-6 hrs as needed for pain   oxyCODONE 20 mg 12 hr tablet Commonly known as: OXYCONTIN Take 1 tablet (20 mg total) by mouth every 12 (twelve) hours.   Potassium 95 MG Tabs Take by mouth.   solifenacin 10 MG tablet Commonly known as: VESICARE Take 1 tablet (10 mg total) by mouth daily. Started by: Wilkie Aye, MD   sulfamethoxazole-trimethoprim 800-160 MG tablet Commonly known as: BACTRIM DS Take 1 tablet by mouth 2 (two) times daily.   tacrolimus 1 MG capsule Commonly known as: PROGRAF Dissolve 1 tablet in 1/2 L water. Swish and spit twice daily.   topiramate 100 MG tablet Commonly known as: TOPAMAX Take 200 mg by mouth daily.   trimethoprim 100 MG tablet Commonly known as: TRIMPEX Take 1 tablet (100 mg total) by mouth at bedtime.   Trulicity 1.5 MG/0.5ML Sopn Generic drug: Dulaglutide Inject 1.5 mg into the skin once a week.   zolpidem  10 MG tablet Commonly known as: AMBIEN Take 10 mg by mouth at bedtime as needed.        Allergies:  Allergies  Allergen Reactions   Methotrexate Derivatives Other (See Comments)    Blisters in mouth   Lyrica [Pregabalin] Nausea And Vomiting and Swelling    Family History: Family History  Problem Relation Age of Onset   Cancer Father    Pneumonia Mother     Social History:  reports that she has been smoking. She has a 30.00 pack-year smoking history. She has never used smokeless tobacco. She reports that she does not drink alcohol and does not use drugs.  ROS: All other review of systems were reviewed and are negative except what is  noted above in HPI  Physical Exam: BP (!) 89/60   Pulse 99   Constitutional:  Alert and oriented, No acute distress. HEENT: [place] AT, moist mucus membranes.  Trachea midline, no masses. Cardiovascular: No clubbing, cyanosis, or edema. Respiratory: Normal respiratory effort, no increased work of breathing. GI: Abdomen is soft, nontender, nondistended, no abdominal masses GU: No CVA tenderness.  Lymph: No cervical or inguinal lymphadenopathy. Skin: No rashes, bruises or suspicious lesions. Neurologic: Grossly intact, no focal deficits, moving all 4 extremities. Psychiatric: Normal mood and affect.  Laboratory Data: Lab Results  Component Value Date   WBC 18.1 (H) 05/11/2012   HGB 13.4 05/11/2012   HCT 39.9 05/11/2012   MCV 91.1 05/11/2012   PLT 276 05/11/2012    Lab Results  Component Value Date   CREATININE 0.75 05/15/2021    No results found for: PSA  No results found for: TESTOSTERONE  No results found for: HGBA1C  Urinalysis    Component Value Date/Time   COLORURINE YELLOW 05/03/2012 0945   APPEARANCEUR Clear 05/15/2021 1442   LABSPEC 1.006 05/03/2012 0945   PHURINE 5.0 05/03/2012 0945   GLUCOSEU Trace (A) 05/15/2021 1442   HGBUR NEGATIVE 05/03/2012 0945   BILIRUBINUR Negative 05/15/2021 1442   KETONESUR NEGATIVE  05/03/2012 0945   PROTEINUR 1+ (A) 05/15/2021 1442   PROTEINUR NEGATIVE 05/03/2012 0945   UROBILINOGEN 0.2 05/03/2012 0945   NITRITE Positive (A) 05/15/2021 1442   NITRITE NEGATIVE 05/03/2012 0945   LEUKOCYTESUR Negative 05/15/2021 1442    Lab Results  Component Value Date   LABMICR See below: 05/15/2021   WBCUA 0-5 05/15/2021   LABEPIT >10 (A) 05/15/2021   MUCUS Present 05/15/2021   BACTERIA Many (A) 05/15/2021    Pertinent Imaging: CT hematuria 06/06/2021: Images reviewed and discussed with the patient  No results found for this or any previous visit.  No results found for this or any previous visit.  No results found for this or any previous visit.  No results found for this or any previous visit.  No results found for this or any previous visit.  No results found for this or any previous visit.  Results for orders placed during the hospital encounter of 06/06/21  CT HEMATURIA WORKUP  Narrative CLINICAL DATA:  Hematuria, UTI symptoms.  EXAM: CT ABDOMEN AND PELVIS WITHOUT AND WITH CONTRAST  TECHNIQUE: Multidetector CT imaging of the abdomen and pelvis was performed following the standard protocol before and following the bolus administration of intravenous contrast.  RADIATION DOSE REDUCTION: This exam was performed according to the departmental dose-optimization program which includes automated exposure control, adjustment of the mA and/or kV according to patient size and/or use of iterative reconstruction technique.  CONTRAST:  OMNIPAQUE IOHEXOL 300 MG/ML  SOLN  COMPARISON:  None available  FINDINGS: Lower chest: No acute abnormality.  Hepatobiliary: No suspicious hepatic lesion. Gallbladder is unremarkable. No biliary ductal dilation.  Pancreas: No pancreatic ductal dilation or evidence of acute inflammation.  Spleen: No splenomegaly or focal splenic lesion.  Adrenals/Urinary Tract: Bilateral adrenal glands appear normal.  No  hydronephrosis. No renal, ureteral or bladder calculi identified.  No solid enhancing renal mass. Exophytic 15 mm right upper/interpolar renal cyst. Additional hypodense renal lesions are technically too small to accurately characterize but statistically likely reflect cysts.  Kidneys demonstrate symmetric enhancement and excretion of contrast material. No collecting system duplication. No suspicious filling defect visualized within the opacified portions of the collecting systems or ureters on delayed imaging. However, the distal right  ureter is not well opacified limiting evaluation.  Urinary bladder does not demonstrate abnormal wall thickening or suspicious intraluminal filling defect. There is a small to moderate size cystocele  Stomach/Bowel: No radiopaque enteric contrast material was administered. Stomach is unremarkable for degree of distension. No pathologic dilation of small or large bowel. The terminal ileum appears normal. The appendix is not confidently identified however there is no pericecal inflammation. No evidence of acute bowel inflammation. Anorectal junction extends below the coccyx consistent with a rectocele.  Vascular/Lymphatic: Aortic and branch vessel atherosclerosis without abdominal aortic aneurysm. No pathologically enlarged abdominal or pelvic lymph nodes.  Reproductive: The uterus is surgically absent. Vaginal prolapse. No suspicious adnexal mass.  Other: No significant abdominopelvic free fluid. Pelvic floor laxity.  Musculoskeletal: Multilevel degenerative changes spine. No acute osseous abnormality.  IMPRESSION: 1. No renal, ureteral, or bladder calculi identified. No hydronephrosis. 2. No solid enhancing renal mass. 3. Pelvic floor laxity with a small to moderate cystocele, vaginal prolapse and a rectocele. 4. Aortic Atherosclerosis (ICD10-I70.0).   Electronically Signed By: Maudry Mayhew M.D. On: 06/06/2021 15:46  No results  found for this or any previous visit.   Assessment & Plan:    1. Recurrent UTI -We will address her OAb prior to initiating therapy for her UTIs.  - Urinalysis, Routine w reflex microscopic  2. OAB -Start vesicare 10mg  daily - Urinalysis, Routine w reflex microscopic   Return in about 4 weeks (around 07/08/2021).  Wilkie Aye, MD  [facility] [place]

## 2021-06-10 NOTE — Patient Instructions (Signed)

## 2021-06-25 DIAGNOSIS — M5451 Vertebrogenic low back pain: Secondary | ICD-10-CM | POA: Diagnosis not present

## 2021-06-25 DIAGNOSIS — M5459 Other low back pain: Secondary | ICD-10-CM | POA: Diagnosis not present

## 2021-07-05 DIAGNOSIS — M5416 Radiculopathy, lumbar region: Secondary | ICD-10-CM | POA: Diagnosis not present

## 2021-07-10 ENCOUNTER — Ambulatory Visit: Payer: Managed Care, Other (non HMO) | Admitting: Physician Assistant

## 2021-07-22 DIAGNOSIS — M5451 Vertebrogenic low back pain: Secondary | ICD-10-CM | POA: Diagnosis not present

## 2021-07-22 DIAGNOSIS — M5136 Other intervertebral disc degeneration, lumbar region: Secondary | ICD-10-CM | POA: Diagnosis not present

## 2021-10-07 DIAGNOSIS — M5451 Vertebrogenic low back pain: Secondary | ICD-10-CM | POA: Diagnosis not present

## 2021-10-17 DIAGNOSIS — M5136 Other intervertebral disc degeneration, lumbar region: Secondary | ICD-10-CM | POA: Diagnosis not present

## 2021-10-17 DIAGNOSIS — M5451 Vertebrogenic low back pain: Secondary | ICD-10-CM | POA: Diagnosis not present

## 2021-10-28 DIAGNOSIS — M5451 Vertebrogenic low back pain: Secondary | ICD-10-CM | POA: Diagnosis not present

## 2021-11-05 DIAGNOSIS — H35033 Hypertensive retinopathy, bilateral: Secondary | ICD-10-CM | POA: Diagnosis not present

## 2021-11-07 DIAGNOSIS — M5451 Vertebrogenic low back pain: Secondary | ICD-10-CM | POA: Diagnosis not present

## 2021-11-07 DIAGNOSIS — M5126 Other intervertebral disc displacement, lumbar region: Secondary | ICD-10-CM | POA: Diagnosis not present

## 2021-11-15 DIAGNOSIS — M5416 Radiculopathy, lumbar region: Secondary | ICD-10-CM | POA: Diagnosis not present

## 2021-11-18 DIAGNOSIS — Z01818 Encounter for other preprocedural examination: Secondary | ICD-10-CM | POA: Diagnosis not present

## 2021-11-18 DIAGNOSIS — H25811 Combined forms of age-related cataract, right eye: Secondary | ICD-10-CM | POA: Diagnosis not present

## 2021-11-18 DIAGNOSIS — H25812 Combined forms of age-related cataract, left eye: Secondary | ICD-10-CM | POA: Diagnosis not present

## 2021-11-29 DIAGNOSIS — H25811 Combined forms of age-related cataract, right eye: Secondary | ICD-10-CM | POA: Diagnosis not present

## 2021-12-04 ENCOUNTER — Other Ambulatory Visit: Payer: Self-pay | Admitting: Neurosurgery

## 2021-12-11 ENCOUNTER — Other Ambulatory Visit: Payer: Self-pay | Admitting: Neurosurgery

## 2021-12-18 NOTE — Progress Notes (Signed)
Surgical Instructions    Your procedure is scheduled on 01/02/22.  Report to [facility] Main Entrance "A" at 5:30 A.M., then check in with the Admitting office.  Call this number if you have problems the morning of surgery:  [phone]   If you have any questions prior to your surgery date call [phone]: Open Monday-Friday 8am-4pm If you experience any cold or flu symptoms such as cough, fever, chills, shortness of breath, etc. between now and your scheduled surgery, please notify us at the above number     Remember:  Do not eat or drink after midnight the night before your surgery      Take these medicines the morning of surgery with A SIP OF WATER:  ANORO ELLIPTA atorvastatin (LIPITOR) buPROPion (WELLBUTRIN XL) citalopram (CELEXA) cyclobenzaprine (FLEXERIL) DULoxetine (CYMBALTA)  gabapentin (NEURONTIN) HYDROcodone-acetaminophen (NORCO/VICODIN)  metoprolol succinate (TOPROL-XL) norethindrone (AYGESTIN)  ofloxacin (OCUFLOX)  omeprazole (PRILOSEC)  prednisoLONE acetate (PRED FORTE) solifenacin (VESICARE)  tacrolimus (PROGRAF)  topiramate (TOPAMAX)  IF NEEDED: albuterol (VENTOLIN HFA) inhaler- bring with you the day of surgery fluconazole (DIFLUCAN)  As of today, STOP taking any Aspirin (unless otherwise instructed by your surgeon) Aleve, Naproxen, Ibuprofen, Motrin, Advil, Goody's, BC's, all herbal medications, fish oil, diclofenac (VOLTAREN) and all vitamins.  WHAT DO I DO ABOUT MY DIABETES MEDICATION?   Do not take oral diabetes medicines (pills) the morning of surgery.  Do not take JARDIANCE 72 hours prior to surgery. Do not take MOUNJARO one week prior to surgery.      THE MORNING OF SURGERY, do not take metFORMIN (GLUCOPHAGE-XR).  The day of surgery, do not take other diabetes injectables, including Byetta (exenatide), Bydureon (exenatide ER), Victoza (liraglutide), or Trulicity (dulaglutide).  If your CBG is greater than 220 mg/dL, you may take  of  your sliding scale (correction) dose of insulin.   HOW TO MANAGE YOUR DIABETES BEFORE AND AFTER SURGERY  Why is it important to control my blood sugar before and after surgery? Improving blood sugar levels before and after surgery helps healing and can limit problems. A way of improving blood sugar control is eating a healthy diet by:  Eating less sugar and carbohydrates  Increasing activity/exercise  Talking with your doctor about reaching your blood sugar goals High blood sugars (greater than 180 mg/dL) can raise your risk of infections and slow your recovery, so you will need to focus on controlling your diabetes during the weeks before surgery. Make sure that the doctor who takes care of your diabetes knows about your planned surgery including the date and location.  How do I manage my blood sugar before surgery? Check your blood sugar at least 4 times a day, starting 2 days before surgery, to make sure that the level is not too high or low.  Check your blood sugar the morning of your surgery when you wake up and every 2 hours until you get to the Short Stay unit.  If your blood sugar is less than 70 mg/dL, you will need to treat for low blood sugar: Do not take insulin. Treat a low blood sugar (less than 70 mg/dL) with  cup of clear juice (cranberry or apple), 4 glucose tablets, OR glucose gel. Recheck blood sugar in 15 minutes after treatment (to make sure it is greater than 70 mg/dL). If your blood sugar is not greater than 70 mg/dL on recheck, call [phone] for further instructions. Report your blood sugar to the short stay nurse when you get to Short Stay.  If you are admitted to the hospital after surgery: Your blood sugar will be checked by the staff and you will probably be given insulin after surgery (instead of oral diabetes medicines) to make sure you have good blood sugar levels. The goal for blood sugar control after surgery is 80-180 mg/dL.      Do not wear  jewelry or makeup. Do not wear lotions, powders, perfumes/cologne or deodorant. Do not shave 48 hours prior to surgery.   Do not bring valuables to the hospital. Do not wear nail polish, gel polish, artificial nails, or any other type of covering on natural nails (fingers and toes) If you have artificial nails or gel coating that need to be removed by a nail salon, please have this removed prior to surgery. Artificial nails or gel coating may interfere with anesthesia's ability to adequately monitor your vital signs.  [facility] is not responsible for any belongings or valuables.    Do NOT Smoke (Tobacco/Vaping)  24 hours prior to your procedure  If you use a CPAP at night, you may bring your mask for your overnight stay.   Contacts, glasses, hearing aids, dentures or partials may not be worn into surgery, please bring cases for these belongings   For patients admitted to the hospital, discharge time will be determined by your treatment team.   Patients discharged the day of surgery will not be allowed to drive home, and someone needs to stay with them for 24 hours.   SURGICAL WAITING ROOM VISITATION Patients having surgery or a procedure may have no more than 2 support people in the waiting area - these visitors may rotate.   Children under the age of 61 must have an adult with them who is not the patient. If the patient needs to stay at the hospital during part of their recovery, the visitor guidelines for inpatient rooms apply. Pre-op nurse will coordinate an appropriate time for 1 support person to accompany patient in pre-op.  This support person may not rotate.   Please refer to the [facility] website for the visitor guidelines for Inpatients (after your surgery is over and you are in a regular room).    Special instructions:    Oral Hygiene is also important to reduce your risk of infection.  Remember - BRUSH YOUR TEETH THE MORNING OF SURGERY WITH YOUR REGULAR  TOOTHPASTE   [facility]- Preparing For Surgery  Before surgery, you can play an important role. Because skin is not sterile, your skin needs to be as free of germs as possible. You can reduce the number of germs on your skin by washing with CHG (chlorahexidine gluconate) Soap before surgery.  CHG is an antiseptic cleaner which kills germs and bonds with the skin to continue killing germs even after washing.     Please do not use if you have an allergy to CHG or antibacterial soaps. If your skin becomes reddened/irritated stop using the CHG.  Do not shave (including legs and underarms) for at least 48 hours prior to first CHG shower. It is OK to shave your face.  Please follow these instructions carefully.     Shower the NIGHT BEFORE SURGERY and the MORNING OF SURGERY with CHG Soap.   If you chose to wash your hair, wash your hair first as usual with your normal shampoo. After you shampoo, rinse your hair and body thoroughly to remove the shampoo.  Then [employer] and genitals (private parts) with your normal soap and rinse  thoroughly to remove soap.  After that Use CHG Soap as you would any other liquid soap. You can apply CHG directly to the skin and wash gently with a scrungie or a clean washcloth.   Apply the CHG Soap to your body ONLY FROM THE NECK DOWN.  Do not use on open wounds or open sores. Avoid contact with your eyes, ears, mouth and genitals (private parts). Wash Face and genitals (private parts)  with your normal soap.   Wash thoroughly, paying special attention to the area where your surgery will be performed.  Thoroughly rinse your body with warm water from the neck down.  DO NOT shower/wash with your normal soap after using and rinsing off the CHG Soap.  Pat yourself dry with a CLEAN TOWEL.  Wear CLEAN PAJAMAS to bed the night before surgery  Place CLEAN SHEETS on your bed the night before your surgery  DO NOT SLEEP WITH PETS.   Day of Surgery: Take a shower with  CHG soap. Wear Clean/Comfortable clothing the morning of surgery Do not apply any deodorants/lotions.   Remember to brush your teeth WITH YOUR REGULAR TOOTHPASTE.    If you received a COVID test during your pre-op visit, it is requested that you wear a mask when out in public, stay away from anyone that may not be feeling well, and notify your surgeon if you develop symptoms. If you have been in contact with anyone that has tested positive in the last 10 days, please notify your surgeon.    Please read over the following fact sheets that you were given.

## 2021-12-19 ENCOUNTER — Other Ambulatory Visit: Payer: Self-pay

## 2021-12-19 ENCOUNTER — Encounter (HOSPITAL_COMMUNITY): Payer: Self-pay

## 2021-12-19 ENCOUNTER — Encounter (HOSPITAL_COMMUNITY)
Admission: RE | Admit: 2021-12-19 | Discharge: 2021-12-19 | Disposition: A | Discharge status: Home / Self Care | Payer: Managed Care, Other (non HMO) | Source: Ambulatory Visit | Attending: Neurosurgery | Admitting: Neurosurgery

## 2021-12-19 VITALS — BP 120/78 | HR 112 | Temp 98.0°F | Resp 18 | Ht 64.0 in | Wt 226.2 lb

## 2021-12-19 DIAGNOSIS — Z01818 Encounter for other preprocedural examination: Secondary | ICD-10-CM | POA: Diagnosis not present

## 2021-12-19 DIAGNOSIS — I10 Essential (primary) hypertension: Secondary | ICD-10-CM | POA: Insufficient documentation

## 2021-12-19 DIAGNOSIS — Z79899 Other long term (current) drug therapy: Secondary | ICD-10-CM | POA: Insufficient documentation

## 2021-12-19 DIAGNOSIS — E119 Type 2 diabetes mellitus without complications: Secondary | ICD-10-CM | POA: Diagnosis not present

## 2021-12-19 DIAGNOSIS — M5116 Intervertebral disc disorders with radiculopathy, lumbar region: Secondary | ICD-10-CM | POA: Diagnosis not present

## 2021-12-19 DIAGNOSIS — N3281 Overactive bladder: Secondary | ICD-10-CM | POA: Insufficient documentation

## 2021-12-19 DIAGNOSIS — I700 Atherosclerosis of aorta: Secondary | ICD-10-CM | POA: Diagnosis not present

## 2021-12-19 DIAGNOSIS — K589 Irritable bowel syndrome without diarrhea: Secondary | ICD-10-CM | POA: Insufficient documentation

## 2021-12-19 DIAGNOSIS — G4733 Obstructive sleep apnea (adult) (pediatric): Secondary | ICD-10-CM | POA: Insufficient documentation

## 2021-12-19 DIAGNOSIS — L439 Lichen planus, unspecified: Secondary | ICD-10-CM | POA: Diagnosis not present

## 2021-12-19 DIAGNOSIS — M069 Rheumatoid arthritis, unspecified: Secondary | ICD-10-CM | POA: Diagnosis not present

## 2021-12-19 DIAGNOSIS — F419 Anxiety disorder, unspecified: Secondary | ICD-10-CM | POA: Diagnosis not present

## 2021-12-19 DIAGNOSIS — M797 Fibromyalgia: Secondary | ICD-10-CM | POA: Insufficient documentation

## 2021-12-19 DIAGNOSIS — N816 Rectocele: Secondary | ICD-10-CM | POA: Diagnosis not present

## 2021-12-19 DIAGNOSIS — N8110 Cystocele, unspecified: Secondary | ICD-10-CM | POA: Diagnosis not present

## 2021-12-19 DIAGNOSIS — R000 Tachycardia, unspecified: Secondary | ICD-10-CM | POA: Diagnosis not present

## 2021-12-19 DIAGNOSIS — K219 Gastro-esophageal reflux disease without esophagitis: Secondary | ICD-10-CM | POA: Diagnosis not present

## 2021-12-19 HISTORY — DX: Sleep apnea, unspecified: G47.30

## 2021-12-19 LAB — SURGICAL PCR SCREEN
MRSA, PCR: NEGATIVE
Staphylococcus aureus: NEGATIVE

## 2021-12-19 LAB — BASIC METABOLIC PANEL
Anion gap: 10 (ref 5–15)
BUN: 8 mg/dL (ref 6–20)
CO2: 24 mmol/L (ref 22–32)
Calcium: 9.2 mg/dL (ref 8.9–10.3)
Chloride: 103 mmol/L (ref 98–111)
Creatinine, Ser: 0.75 mg/dL (ref 0.44–1.00)
GFR, Estimated: >60 mL/min (ref >60)
Glucose, Bld: 194 mg/dL — ABNORMAL HIGH (ref 70–99)
Potassium: 3.6 mmol/L (ref 3.5–5.1)
Sodium: 137 mmol/L (ref 135–145)

## 2021-12-19 LAB — CBC
HCT: 48.4 % — ABNORMAL HIGH (ref 36.0–46.0)
Hemoglobin: 15.8 g/dL — ABNORMAL HIGH (ref 12.0–15.0)
MCH: 31.7 pg (ref 26.0–34.0)
MCHC: 32.6 g/dL (ref 30.0–36.0)
MCV: 97 fL (ref 80.0–100.0)
Platelets: 397 10*3/uL (ref 150–400)
RBC: 4.99 MIL/uL (ref 3.87–5.11)
RDW: 14 % (ref 11.5–15.5)
WBC: 18.9 10*3/uL — ABNORMAL HIGH (ref 4.0–10.5)
nRBC: 0 % (ref 0.0–0.2)

## 2021-12-19 LAB — HEMOGLOBIN A1C
Hgb A1c MFr Bld: 7.5 % — ABNORMAL HIGH (ref 4.8–5.6)
Mean Plasma Glucose: 168.55 mg/dL

## 2021-12-19 LAB — GLUCOSE, CAPILLARY: Glucose-Capillary: 282 mg/dL — ABNORMAL HIGH (ref 70–99)

## 2021-12-19 NOTE — Progress Notes (Signed)
PCP - Dr. Kern [place] Cardiologist - Denies Urology: Dr. Nicolette Bang  PPM/ICD - Denies   Chest x-ray - N/A EKG - 12/19/21 Stress Test - Denies ECHO - 03/14/2014 Cardiac Cath - Denies  Sleep Study - Denies  Fasting Blood Sugar - 200 Checks Blood Sugar: 1-2x per week  Blood Thinner Instructions: N/A Aspirin Instructions: Per surgeon, stop 5 days prior to surgery.  ERAS Protcol - N/A  COVID TEST- N/A   Anesthesia review: Yes, abnormal EKG  Patient denies shortness of breath, fever, cough and chest pain at PAT appointment   All instructions explained to the patient, with a verbal understanding of the material. Patient agrees to go over the instructions while at home for a better understanding. Patient also instructed to self quarantine after being tested for COVID-19. The opportunity to ask questions was provided.

## 2021-12-19 NOTE — Pre-Procedure Instructions (Addendum)
Surgical Instructions    Your procedure is scheduled on Thursday, January 02, 2022 at 7:30 AM .  Report to [facility] Main Entrance "A" at 5:30 A.M., then check in with the Admitting office.  Call this number if you have problems the morning of surgery:  [phone]   If you have any questions prior to your surgery date call [phone]: Open Monday-Friday 8am-4pm If you experience any cold or flu symptoms such as cough, fever, chills, shortness of breath, etc. between now and your scheduled surgery, please notify us at the above number     Remember:  Do not eat or drink after midnight the night before your surgery      Take these medicines the morning of surgery with A SIP OF WATER:  ANORO ELLIPTA atorvastatin (LIPITOR) buPROPion (WELLBUTRIN XL) citalopram (CELEXA) cyclobenzaprine (FLEXERIL) DULoxetine (CYMBALTA)  gabapentin (NEURONTIN) HYDROcodone-acetaminophen (NORCO/VICODIN)  metoprolol succinate (TOPROL-XL) norethindrone (AYGESTIN)  ofloxacin (OCUFLOX)  omeprazole (PRILOSEC)  prednisoLONE acetate (PRED FORTE) solifenacin (VESICARE)  tacrolimus (PROGRAF)  topiramate (TOPAMAX)  IF NEEDED: albuterol (VENTOLIN HFA) inhaler- bring with you the day of surgery fluconazole (DIFLUCAN)  Follow your surgeon's instructions on when to stop Aspirin.  If no instructions were given by your surgeon then you will need to call the office to get those instructions.     As of today, STOP taking any Aleve, Naproxen, Ibuprofen, Motrin, Advil, Goody's, BC's, all herbal medications, fish oil, diclofenac (VOLTAREN) and all vitamins.  WHAT DO I DO ABOUT MY DIABETES MEDICATION?    THE MORNING OF SURGERY, do not take metFORMIN (GLUCOPHAGE-XR).  Stop taking JARDIANCE 72 hours prior to your surgery. Your last dose will be on 12/29/21.  Stop taking MOUNJARO one week prior to your surgery. Your last dose will be on 12/25/21.   HOW TO MANAGE YOUR DIABETES BEFORE AND AFTER SURGERY  Why is  it important to control my blood sugar before and after surgery? Improving blood sugar levels before and after surgery helps healing and can limit problems. A way of improving blood sugar control is eating a healthy diet by:  Eating less sugar and carbohydrates  Increasing activity/exercise  Talking with your doctor about reaching your blood sugar goals High blood sugars (greater than 180 mg/dL) can raise your risk of infections and slow your recovery, so you will need to focus on controlling your diabetes during the weeks before surgery. Make sure that the doctor who takes care of your diabetes knows about your planned surgery including the date and location.  How do I manage my blood sugar before surgery? Check your blood sugar at least 4 times a day, starting 2 days before surgery, to make sure that the level is not too high or low.  Check your blood sugar the morning of your surgery when you wake up and every 2 hours until you get to the Short Stay unit.  If your blood sugar is less than 70 mg/dL, you will need to treat for low blood sugar: Do not take insulin. Treat a low blood sugar (less than 70 mg/dL) with  cup of clear juice (cranberry or apple), 4 glucose tablets, OR glucose gel. Recheck blood sugar in 15 minutes after treatment (to make sure it is greater than 70 mg/dL). If your blood sugar is not greater than 70 mg/dL on recheck, call [phone] for further instructions. Report your blood sugar to the short stay nurse when you get to Short Stay.  If you are admitted to the hospital after surgery:  Your blood sugar will be checked by the staff and you will probably be given insulin after surgery (instead of oral diabetes medicines) to make sure you have good blood sugar levels. The goal for blood sugar control after surgery is 80-180 mg/dL.   Do NOT Smoke (Tobacco/Vaping)  24 hours prior to your procedure  If you use a CPAP at night, you may bring your mask for your overnight  stay.   Contacts, glasses, hearing aids, dentures or partials may not be worn into surgery, please bring cases for these belongings   For patients admitted to the hospital, discharge time will be determined by your treatment team.   Patients discharged the day of surgery will not be allowed to drive home, and someone needs to stay with them for 24 hours.   SURGICAL WAITING ROOM VISITATION Patients having surgery or a procedure may have no more than 2 support people in the waiting area - these visitors may rotate.   Children under the age of 5 must have an adult with them who is not the patient. If the patient needs to stay at the hospital during part of their recovery, the visitor guidelines for inpatient rooms apply. Pre-op nurse will coordinate an appropriate time for 1 support person to accompany patient in pre-op.  This support person may not rotate.   Please refer to the [facility] website for the visitor guidelines for Inpatients (after your surgery is over and you are in a regular room).    Special instructions:    Oral Hygiene is also important to reduce your risk of infection.  Remember - BRUSH YOUR TEETH THE MORNING OF SURGERY WITH YOUR REGULAR TOOTHPASTE   [facility]- Preparing For Surgery  Before surgery, you can play an important role. Because skin is not sterile, your skin needs to be as free of germs as possible. You can reduce the number of germs on your skin by washing with CHG (chlorahexidine gluconate) Soap before surgery.  CHG is an antiseptic cleaner which kills germs and bonds with the skin to continue killing germs even after washing.     Please do not use if you have an allergy to CHG or antibacterial soaps. If your skin becomes reddened/irritated stop using the CHG.  Do not shave (including legs and underarms) for at least 48 hours prior to first CHG shower. It is OK to shave your face.  Please follow these instructions carefully.     Shower the NIGHT  BEFORE SURGERY and the MORNING OF SURGERY with CHG Soap.   If you chose to wash your hair, wash your hair first as usual with your normal shampoo. After you shampoo, rinse your hair and body thoroughly to remove the shampoo.  Then [employer] and genitals (private parts) with your normal soap and rinse thoroughly to remove soap.  After that Use CHG Soap as you would any other liquid soap. You can apply CHG directly to the skin and wash gently with a scrungie or a clean washcloth.   Apply the CHG Soap to your body ONLY FROM THE NECK DOWN.  Do not use on open wounds or open sores. Avoid contact with your eyes, ears, mouth and genitals (private parts). Wash Face and genitals (private parts)  with your normal soap.   Wash thoroughly, paying special attention to the area where your surgery will be performed.  Thoroughly rinse your body with warm water from the neck down.  DO NOT shower/wash with your normal soap after  using and rinsing off the CHG Soap.  Pat yourself dry with a CLEAN TOWEL.  Wear CLEAN PAJAMAS to bed the night before surgery  Place CLEAN SHEETS on your bed the night before your surgery  DO NOT SLEEP WITH PETS.   Day of Surgery: Take a shower with CHG soap. Wear Clean/Comfortable clothing the morning of surgery Do not wear jewelry or makeup. Do not wear lotions, powders, perfumes/cologne or deodorant. Do not shave 48 hours prior to surgery.   Do not bring valuables to the hospital. Do not wear nail polish, gel polish, artificial nails, or any other type of covering on natural nails (fingers and toes) If you have artificial nails or gel coating that need to be removed by a nail salon, please have this removed prior to surgery. Artificial nails or gel coating may interfere with anesthesia's ability to adequately monitor your vital signs. [facility] is not responsible for any belongings or valuables.  Remember to brush your teeth WITH YOUR REGULAR TOOTHPASTE.  If you received  a COVID test during your pre-op visit, it is requested that you wear a mask when out in public, stay away from anyone that may not be feeling well, and notify your surgeon if you develop symptoms. If you have been in contact with anyone that has tested positive in the last 10 days, please notify your surgeon.    Please read over the following fact sheets that you were given.

## 2021-12-20 ENCOUNTER — Encounter (HOSPITAL_COMMUNITY): Payer: Self-pay | Admitting: Vascular Surgery

## 2021-12-20 NOTE — Progress Notes (Signed)
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 BMI is consistent with obesity.  She is on Prograf 1 mg dissolve in water and swish and spit BID as needed for lichen planus sores. Denied issues currently with oral sores.    On Mounjara 7.5 mg weekly (Monday), Jardiance 25 mg daily, and metformin 500 mg BID for DM. She is aware that she should hold Mounjara after 12/23/21 dose until after surgery. To also hold Jardiance 72 hours before surgery and metformin on the morning of surgery. She reported instructions to hold ASA for 5 days prior to surgery.   I called and spoke with patient because her labs 12/19/21 labs results revealed a WBC 18.9. Also H/H 15.8/48.4, PLT 397, A1c 7.5%, Cr 0.75. She denied rash, diarrhea, abdominal pain, fever, chills. She did say that she has an intermittent chronic cough related to COPD and felt she was coughing a little more about a week ago. As a precaution she told a home COVID-19 test which was negative. She did not feel particularly ill or sick when she took the test. She does feel a little tired, but says she has been running to appointments all week. Denied chest pain. No known CAD or CHF history. She feels breathing is at baseline. No SOB at rest and stable DOE with some activities. She is able to do her ADLs and go to the store, but walking is limited due to "bone on bone"  in her knees. She also reported chronic UTI (with partial bladder emptying) but with more acute symptoms over the last two days--dysuria and urinary frequency. No hematuria or abdominal pain. She had planned to "wait it out" to see if her urinary symptoms would go away on their own. She feels like this is her "typical" UTI and had planned to monitor her symptoms for a few days before calling Dr. Alyson Ingles. She had a steroid injection in her back about 1 months ago and is on steroid eye drops after cataract surgery about 2 weeks ago. She lives at home with her husband. She was able to answer questions appropriately. No coughing or conversational dyspnea noted.   When I spoke with her, I had no recent comparison labs (WBC was 18K post-uni knee in 2014, but 9.7 03/03/18). She was unsure if baseline WBC is elevated or not. Her last steroid injection was about a month ago, so I don't think this is contributing. She did report some coughing about a week ago, but otherwise no definite sick symptoms and has not noticed any new SOB or activity intolerance. O2 sat 97% at PAT, RR 18. I did encourage her to contact Dr. Alyson Ingles regarding her dysuria, especially given her leukocytosis. I also advised her to seek immediate medical attention if she develops new or worsening symptoms such as fever, abdominal pain, worsening cough, feeling poorly. After I spoke with her, I called and spoke with her PCP Dr. Quillian Quince about lab  results, my conversation with her, and upcoming surgery plans. He said her WBC is typically mildly elevated ~ 13-14K when he attributed more to her autoimmune issues. Recently she has not been 18K. He plans to have his staff follow-up with her. She may need a UA there if not done with urology.   Staff message sent to Dr. Lovell Sheehan and voice message left for his scheduler [place]. Chart will be left for follow-up.    VS: BP 120/78   Pulse (!) 112   Temp 36.7 C (Oral)   Resp 18   Ht 5\' 4"  (1.626 m)   Wt  102.6 kg   SpO2 97%   BMI 38.83 kg/m    PROVIDERS: Richardean Chimera, MD is PCP  Wilkie Aye, MD is urologist Azucena Fallen, PA-C is rheumatology provider [facility])   LABS: See DISCUSSION. (all labs ordered are listed, but only abnormal results are displayed)  Labs Reviewed  GLUCOSE, CAPILLARY - Abnormal; Notable for the following components:      Result Value   Glucose-Capillary 282 (*)    All other components within normal limits  CBC - Abnormal; Notable for the following components:   WBC 18.9 (*)    Hemoglobin 15.8 (*)    HCT 48.4 (*)    All other components within normal limits  BASIC METABOLIC PANEL - Abnormal; Notable for the following components:   Glucose, Bld 194 (*)    All other components within normal limits  HEMOGLOBIN A1C - Abnormal; Notable for the following components:   Hgb A1c MFr Bld 7.5 (*)    All other components within normal limits  SURGICAL PCR SCREEN     IMAGES: CT Abd/pelvis 06/06/21: IMPRESSION: 1. No renal, ureteral, or bladder calculi identified. No hydronephrosis. 2. No solid enhancing renal mass. 3. Pelvic floor laxity with a small to moderate cystocele, vaginal prolapse and a rectocele. 4. Aortic Atherosclerosis (ICD10-I70.0).   EKG: Sinus tachycardia at 102 bpm  Possible Left atrial enlargement   CV: Remote history of echocardiograms on 03/14/14 (not viewable in [facility]) and on 3/28/211. Per my 2014 pre-anesthesia note, her echo on 06/18/09 showed atrial septal aneurysm, mild concentric LVH, global left ventricular wall motion and contractility are within normal limits, EF 55-60%, mild mitral regurgitation, trace tricuspid regurgitation.   Past Medical History:  Diagnosis Date   Anxiety    Depression    Fibromyalgia    Fibromyalgia syndrome    GERD (gastroesophageal reflux disease)    Headache(784.0)    Mirgraine- January 2014- last one   History of blood transfusion    after C- Section    Hypertension    IBS (irritable bowel syndrome)    Left knee DJD    Sleep apnea    Diagnosed 5 years ago    Past Surgical History:  Procedure Laterality Date   CESAREAN SECTION     KNEE ARTHROSCOPY Left 09/2011   PARTIAL KNEE ARTHROPLASTY Left 05/10/2012   Procedure: UNICOMPARTMENTAL KNEE;  Surgeon: Nilda Simmer, MD;  Location: [facility] OR;  Service: Orthopedics;  Laterality: Left;   TUBAL LIGATION     VAGINAL HYSTERECTOMY      MEDICATIONS:  Abatacept 125 MG/ML SOSY   acetaminophen (TYLENOL) 325 MG tablet   albuterol (VENTOLIN HFA) 108 (90 Base) MCG/ACT inhaler   ANORO ELLIPTA 62.5-25 MCG/INH AEPB   aspirin 81 MG chewable tablet   atorvastatin (LIPITOR) 40 MG tablet   bisacodyl (DULCOLAX) 5 MG EC tablet   buPROPion (WELLBUTRIN XL) 150  MG 24 hr tablet   celecoxib (CELEBREX) 200 MG capsule   citalopram (CELEXA) 20 MG tablet   clobetasol ointment (TEMOVATE) 0.05 %   cyclobenzaprine (FLEXERIL) 10 MG tablet   diclofenac (VOLTAREN) 75 MG EC tablet   docusate sodium 100 MG CAPS   DULoxetine (CYMBALTA) 60 MG capsule   fluconazole (DIFLUCAN) 364 MG tablet   folic acid (FOLVITE) 1 MG tablet   gabapentin (NEURONTIN) 600 MG tablet   HYDROcodone-acetaminophen (NORCO/VICODIN) 5-325 MG tablet   hydrocortisone 2.5 % cream   JARDIANCE 25 MG TABS tablet   lisinopril-hydrochlorothiazide (PRINZIDE,ZESTORETIC) 20-12.5 MG per tablet   metFORMIN (GLUCOPHAGE-XR) 500 MG 24 hr tablet   metoprolol succinate (TOPROL-XL) 25 MG 24 hr tablet   MOUNJARO 7.5 MG/0.5ML Pen   nitrofurantoin (MACRODANTIN) 50 MG capsule   norethindrone (AYGESTIN) 5 MG tablet   ofloxacin (OCUFLOX) 0.3 % ophthalmic solution   omeprazole (PRILOSEC) 20 MG capsule   oxyCODONE (OXY IR/ROXICODONE) 5 MG immediate release tablet   OxyCODONE (OXYCONTIN) 20 mg T12A   prednisoLONE acetate (PRED FORTE) 1 % ophthalmic suspension   solifenacin (VESICARE) 10 MG tablet   sulfamethoxazole-trimethoprim (BACTRIM DS) 800-160 MG tablet    tacrolimus (PROGRAF) 1 MG capsule   topiramate (TOPAMAX) 100 MG tablet   trimethoprim (TRIMPEX) 100 MG tablet   zolpidem (AMBIEN) 10 MG tablet   No current facility-administered medications for this encounter.    Myra Gianotti, PA-C Surgical Short Stay/Anesthesiology [facility] Phone [phone] [facility] Phone [phone] 12/20/2021 5:14 PM

## 2021-12-23 DIAGNOSIS — R5383 Other fatigue: Secondary | ICD-10-CM | POA: Diagnosis not present

## 2021-12-24 DIAGNOSIS — R300 Dysuria: Secondary | ICD-10-CM | POA: Diagnosis not present

## 2021-12-24 DIAGNOSIS — Z6838 Body mass index (BMI) 38.0-38.9, adult: Secondary | ICD-10-CM | POA: Diagnosis not present

## 2022-01-02 ENCOUNTER — Other Ambulatory Visit: Payer: Self-pay | Admitting: Neurosurgery

## 2022-01-13 ENCOUNTER — Ambulatory Visit (HOSPITAL_COMMUNITY)
Admission: RE | Admit: 2022-01-13 | Payer: Managed Care, Other (non HMO) | Source: Home / Self Care | Admitting: Neurosurgery

## 2022-01-13 ENCOUNTER — Encounter (HOSPITAL_COMMUNITY): Admission: RE | Payer: Self-pay | Source: Home / Self Care

## 2022-01-13 SURGERY — LUMBAR LAMINECTOMY/DECOMPRESSION MICRODISCECTOMY 1 LEVEL
Anesthesia: General | Laterality: Left

## 2022-01-24 DIAGNOSIS — I2510 Atherosclerotic heart disease of native coronary artery without angina pectoris: Secondary | ICD-10-CM | POA: Diagnosis not present

## 2022-01-24 DIAGNOSIS — F17210 Nicotine dependence, cigarettes, uncomplicated: Secondary | ICD-10-CM | POA: Diagnosis not present

## 2022-01-24 DIAGNOSIS — Z122 Encounter for screening for malignant neoplasm of respiratory organs: Secondary | ICD-10-CM | POA: Diagnosis not present

## 2022-01-24 DIAGNOSIS — I700 Atherosclerosis of aorta: Secondary | ICD-10-CM | POA: Diagnosis not present

## 2022-01-24 DIAGNOSIS — J439 Emphysema, unspecified: Secondary | ICD-10-CM | POA: Diagnosis not present

## 2022-02-21 ENCOUNTER — Inpatient Hospital Stay: Payer: Managed Care, Other (non HMO)

## 2022-02-21 ENCOUNTER — Inpatient Hospital Stay: Payer: Managed Care, Other (non HMO) | Attending: Hematology | Admitting: Hematology

## 2022-02-21 ENCOUNTER — Encounter: Payer: Self-pay | Admitting: Hematology

## 2022-02-21 VITALS — BP 110/76 | HR 110 | Temp 98.0°F | Resp 16 | Ht 64.0 in | Wt 229.6 lb

## 2022-02-21 DIAGNOSIS — Z79899 Other long term (current) drug therapy: Secondary | ICD-10-CM | POA: Diagnosis not present

## 2022-02-21 DIAGNOSIS — Z7984 Long term (current) use of oral hypoglycemic drugs: Secondary | ICD-10-CM | POA: Insufficient documentation

## 2022-02-21 DIAGNOSIS — Z803 Family history of malignant neoplasm of breast: Secondary | ICD-10-CM | POA: Diagnosis not present

## 2022-02-21 DIAGNOSIS — Z791 Long term (current) use of non-steroidal anti-inflammatories (NSAID): Secondary | ICD-10-CM | POA: Insufficient documentation

## 2022-02-21 DIAGNOSIS — K219 Gastro-esophageal reflux disease without esophagitis: Secondary | ICD-10-CM | POA: Diagnosis not present

## 2022-02-21 DIAGNOSIS — K589 Irritable bowel syndrome without diarrhea: Secondary | ICD-10-CM | POA: Diagnosis not present

## 2022-02-21 DIAGNOSIS — Z7982 Long term (current) use of aspirin: Secondary | ICD-10-CM | POA: Diagnosis not present

## 2022-02-21 DIAGNOSIS — I10 Essential (primary) hypertension: Secondary | ICD-10-CM | POA: Insufficient documentation

## 2022-02-21 DIAGNOSIS — D729 Disorder of white blood cells, unspecified: Secondary | ICD-10-CM | POA: Diagnosis not present

## 2022-02-21 DIAGNOSIS — M069 Rheumatoid arthritis, unspecified: Secondary | ICD-10-CM | POA: Insufficient documentation

## 2022-02-21 DIAGNOSIS — D72825 Bandemia: Secondary | ICD-10-CM

## 2022-02-21 DIAGNOSIS — M797 Fibromyalgia: Secondary | ICD-10-CM | POA: Diagnosis not present

## 2022-02-21 DIAGNOSIS — D72829 Elevated white blood cell count, unspecified: Secondary | ICD-10-CM | POA: Diagnosis not present

## 2022-02-21 DIAGNOSIS — F17210 Nicotine dependence, cigarettes, uncomplicated: Secondary | ICD-10-CM | POA: Insufficient documentation

## 2022-02-21 DIAGNOSIS — G4730 Sleep apnea, unspecified: Secondary | ICD-10-CM | POA: Diagnosis not present

## 2022-02-21 LAB — CBC WITH DIFFERENTIAL/PLATELET
Abs Immature Granulocytes: 0.07 10*3/uL (ref 0.00–0.07)
Basophils Absolute: 0 10*3/uL (ref 0.0–0.1)
Basophils Relative: 0 %
Eosinophils Absolute: 0.1 10*3/uL (ref 0.0–0.5)
Eosinophils Relative: 1 %
HCT: 47.1 % — ABNORMAL HIGH (ref 36.0–46.0)
Hemoglobin: 15.6 g/dL — ABNORMAL HIGH (ref 12.0–15.0)
Immature Granulocytes: 1 %
Lymphocytes Relative: 20 %
Lymphs Abs: 2.9 10*3/uL (ref 0.7–4.0)
MCH: 31.5 pg (ref 26.0–34.0)
MCHC: 33.1 g/dL (ref 30.0–36.0)
MCV: 95.2 fL (ref 80.0–100.0)
Monocytes Absolute: 0.7 10*3/uL (ref 0.1–1.0)
Monocytes Relative: 4 %
Neutro Abs: 11 10*3/uL — ABNORMAL HIGH (ref 1.7–7.7)
Neutrophils Relative %: 74 %
Platelets: 404 10*3/uL — ABNORMAL HIGH (ref 150–400)
RBC: 4.95 MIL/uL (ref 3.87–5.11)
RDW: 14.2 % (ref 11.5–15.5)
WBC: 14.8 10*3/uL — ABNORMAL HIGH (ref 4.0–10.5)
nRBC: 0 % (ref 0.0–0.2)

## 2022-02-21 LAB — C-REACTIVE PROTEIN: CRP: 1.1 mg/dL — ABNORMAL HIGH (ref <1.0)

## 2022-02-21 LAB — RETICULOCYTES
Immature Retic Fract: 16.7 % — ABNORMAL HIGH (ref 2.3–15.9)
RBC.: 4.87 MIL/uL (ref 3.87–5.11)
Retic Count, Absolute: 82.8 10*3/uL (ref 19.0–186.0)
Retic Ct Pct: 1.7 % (ref 0.4–3.1)

## 2022-02-21 LAB — SEDIMENTATION RATE: Sed Rate: 24 mm/hr — ABNORMAL HIGH (ref 0–22)

## 2022-02-21 LAB — LACTATE DEHYDROGENASE: LDH: 112 U/L (ref 98–192)

## 2022-02-21 NOTE — Progress Notes (Signed)
CONSULT NOTE  Patient Care Team: Caryl Bis, MD as PCP - General (Family Medicine)  CHIEF COMPLAINTS/PURPOSE OF CONSULTATION:  Neutrophilic leukocytosis.  HISTORY OF PRESENTING ILLNESS:  Stephanie Huang 56 y.o. female is seen today at the request of Dr. Gar Ponto for neutrophilic leukocytosis.  She has history of recurrent UTIs and is on Macrodantin 50 mg daily for the past 1 year.  She follows up with Dr. Alyson Ingles.  She took oral prednisone last time in August 2023.  She reports some night sweats but denies any fevers or weight loss.  She is on Orencia for rheumatoid arthritis for the last 4 years.  She also has fibromyalgia.  She lives at home with her husband.  She is independent of ADLs and IADLs.  She denies any other infections.   MEDICAL HISTORY:  Past Medical History:  Diagnosis Date   Anxiety    Depression    Fibromyalgia    Fibromyalgia syndrome    GERD (gastroesophageal reflux disease)    Headache(784.0)    Mirgraine- January 2014- last one   History of blood transfusion    after C- Section   Hypertension    IBS (irritable bowel syndrome)    Left knee DJD    Sleep apnea    Diagnosed 5 years ago    SURGICAL HISTORY: Past Surgical History:  Procedure Laterality Date   CESAREAN SECTION     KNEE ARTHROSCOPY Left 09/2011   PARTIAL KNEE ARTHROPLASTY Left 05/10/2012   Procedure: UNICOMPARTMENTAL KNEE;  Surgeon: Lorn Junes, MD;  Location: [facility];  Service: Orthopedics;  Laterality: Left;   TUBAL LIGATION     VAGINAL HYSTERECTOMY      SOCIAL HISTORY: Social History   Socioeconomic History   Marital status: Married    Spouse name: Not on file   Number of children: Not on file   Years of education: Not on file   Highest education level: Not on file  Occupational History   Occupation: disabled  Tobacco Use   Smoking status: Every Day    Packs/day: 1.00    Years: 30.00    Total pack years: 30.00    Types: Cigarettes    Last attempt to quit:  03/05/2012    Years since quitting: 9.9   Smokeless tobacco: Never  Substance and Sexual Activity   Alcohol use: No   Drug use: No   Sexual activity: Yes    Birth control/protection: Surgical  Other Topics Concern   Not on file  Social History Narrative   Not on file   Social Determinants of Health   Financial Resource Strain: Not on file  Food Insecurity: Not on file  Transportation Needs: Not on file  Physical Activity: Not on file  Stress: Not on file  Social Connections: Not on file  Intimate Partner Violence: Not on file    FAMILY HISTORY: Family History  Problem Relation Age of Onset   Cancer Father    Pneumonia Mother     ALLERGIES:  is allergic to methotrexate derivatives and lyrica [pregabalin].  MEDICATIONS:  Current Outpatient Medications  Medication Sig Dispense Refill   Abatacept 125 MG/ML SOSY Inject 125 mg into the skin every 7 (seven) days.     acetaminophen (TYLENOL) 325 MG tablet Take 2 tablets (650 mg total) by mouth every 6 (six) hours as needed for pain or fever.     albuterol (VENTOLIN HFA) 108 (90 Base) MCG/ACT inhaler Inhale 1-2 puffs into the lungs every  6 (six) hours as needed for wheezing or shortness of breath.     ANORO ELLIPTA 62.5-25 MCG/INH AEPB Inhale 1 puff into the lungs daily.     aspirin 81 MG chewable tablet Chew 81 mg by mouth daily.     atorvastatin (LIPITOR) 40 MG tablet Take by mouth.     bisacodyl (DULCOLAX) 5 MG EC tablet Take 2 tablets every night with dinner until bowel movement.  LAXITIVE.  Restart if two days since last bowel movement 30 tablet    buPROPion (WELLBUTRIN XL) 150 MG 24 hr tablet Take 150 mg by mouth in the morning and at bedtime.     celecoxib (CELEBREX) 200 MG capsule Take 1 capsule (200 mg total) by mouth daily. 30 capsule 0   citalopram (CELEXA) 20 MG tablet Take 20 mg by mouth daily.      clobetasol ointment (TEMOVATE) 4.01 % Apply 1 Application topically daily as needed (rash).     cyclobenzaprine  (FLEXERIL) 10 MG tablet Take 10 mg by mouth 3 (three) times daily.     diclofenac (VOLTAREN) 75 MG EC tablet Take 75 mg by mouth 2 (two) times daily.     docusate sodium 100 MG CAPS 1 tab 2 times a day while on narcotics.  STOOL SOFTENER 60 capsule 0   DULoxetine (CYMBALTA) 60 MG capsule Take 60 mg by mouth daily.     fluconazole (DIFLUCAN) 150 MG tablet Take 1 tablet (150 mg total) by mouth daily. (Patient taking differently: Take 150 mg by mouth daily as needed (yeast).) 2 tablet 5   folic acid (FOLVITE) 1 MG tablet Take 1 mg by mouth daily.     gabapentin (NEURONTIN) 600 MG tablet Take 600 mg by mouth 3 (three) times daily.     HYDROcodone-acetaminophen (NORCO/VICODIN) 5-325 MG tablet Take 1 tablet by mouth 4 (four) times daily.     hydrocortisone 2.5 % cream Apply 1 Application topically daily as needed (irritation).     JARDIANCE 25 MG TABS tablet Take 25 mg by mouth daily.     lisinopril-hydrochlorothiazide (PRINZIDE,ZESTORETIC) 20-12.5 MG per tablet Take 1 tablet by mouth daily.     metFORMIN (GLUCOPHAGE-XR) 500 MG 24 hr tablet Take 500 mg by mouth 2 (two) times daily with a meal.     metoprolol succinate (TOPROL-XL) 25 MG 24 hr tablet Take 25 mg by mouth daily.     MOUNJARO 7.5 MG/0.5ML Pen Inject 7.5 mg into the skin once a week.     nitrofurantoin (MACRODANTIN) 50 MG capsule Take 50 mg by mouth at bedtime.     norethindrone (AYGESTIN) 5 MG tablet Take 5 mg by mouth daily.     ofloxacin (OCUFLOX) 0.3 % ophthalmic solution Place 1 drop into the right eye daily.     omeprazole (PRILOSEC) 20 MG capsule Take 20 mg by mouth daily.     oxyCODONE (OXY IR/ROXICODONE) 5 MG immediate release tablet 1-2 tablets every 4-6 hrs as needed for pain 100 tablet 0   OxyCODONE (OXYCONTIN) 20 mg T12A Take 1 tablet (20 mg total) by mouth every 12 (twelve) hours. 30 tablet 0   prednisoLONE acetate (PRED FORTE) 1 % ophthalmic suspension Place 1 drop into the right eye daily.     solifenacin (VESICARE) 10 MG  tablet Take 1 tablet (10 mg total) by mouth daily. 30 tablet 1   sulfamethoxazole-trimethoprim (BACTRIM DS) 800-160 MG tablet Take 1 tablet by mouth 2 (two) times daily. 10 tablet 0   tacrolimus (PROGRAF) 1 MG  capsule Take 1 mg by mouth See admin instructions. Dissolve 1 mg into a glass of water, swish and spit up to twice daily as needed for lichen planus sores.     topiramate (TOPAMAX) 100 MG tablet Take 100-200 mg by mouth daily.     trimethoprim (TRIMPEX) 100 MG tablet Take 1 tablet (100 mg total) by mouth at bedtime. 30 tablet 3   zolpidem (AMBIEN) 10 MG tablet Take 10 mg by mouth at bedtime.     No current facility-administered medications for this visit.    REVIEW OF SYSTEMS:   Constitutional: Denies fevers, chills or abnormal night sweats Eyes: Denies blurriness of vision, double vision or watery eyes Ears, nose, mouth, throat, and face: Denies mucositis or sore throat Respiratory: Positive for cough. Cardiovascular: Denies palpitation, chest discomfort or lower extremity swelling Gastrointestinal: Positive for diarrhea and nausea. Skin: Denies abnormal skin rashes Lymphatics: Denies new lymphadenopathy or easy bruising Neurological:Denies numbness, tingling or new weaknesses Behavioral/Psych: Mood is stable, no new changes  All other systems were reviewed with the patient and are negative.  PHYSICAL EXAMINATION: ECOG PERFORMANCE STATUS: 1 - Symptomatic but completely ambulatory  Vitals:   02/21/22 0844  BP: 110/76  Pulse: (!) 110  Resp: 16  Temp: 98 F (36.7 C)  SpO2: 95%   Filed Weights   02/21/22 0844  Weight: 229 lb 9.6 oz (104.1 kg)    GENERAL:alert, no distress and comfortable SKIN: skin color, texture, turgor are normal, no rashes or significant lesions EYES: normal, conjunctiva are pink and non-injected, sclera clear OROPHARYNX:no exudate, no erythema and lips, buccal mucosa, and tongue normal  NECK: supple, thyroid normal size, non-tender, without  nodularity LYMPH:  no palpable lymphadenopathy in the cervical, axillary or inguinal LUNGS: clear to auscultation and percussion with normal breathing effort HEART: regular rate & rhythm and no murmurs and no lower extremity edema ABDOMEN:abdomen soft, non-tender and normal bowel sounds Musculoskeletal:no cyanosis of digits and no clubbing  PSYCH: alert & oriented x 3 with fluent speech NEURO: no focal motor/sensory deficits  LABORATORY DATA:  I have reviewed the data as listed Recent Results (from the past 2160 hour(s))  Glucose, capillary     Status: Abnormal   Collection Time: 12/19/21 10:10 AM  Result Value Ref Range   Glucose-Capillary 282 (H) 70 - 99 mg/dL    Comment: Glucose reference range applies only to samples taken after fasting for at least 8 hours.  Surgical pcr screen     Status: None   Collection Time: 12/19/21 10:23 AM   Specimen: Nasal Mucosa; Nasal Swab  Result Value Ref Range   MRSA, PCR NEGATIVE NEGATIVE   Staphylococcus aureus NEGATIVE NEGATIVE    Comment: (NOTE) The Xpert SA Assay (FDA approved for NASAL specimens in patients 66 years of age and older), is one component of a comprehensive surveillance program. It is not intended to diagnose infection nor to guide or monitor treatment. Performed at [facility], [place] [address] [place], [place] [address]   CBC per protocol     Status: Abnormal   Collection Time: 12/19/21 10:45 AM  Result Value Ref Range   WBC 18.9 (H) 4.0 - 10.5 K/uL   RBC 4.99 3.87 - 5.11 MIL/uL   Hemoglobin 15.8 (H) 12.0 - 15.0 g/dL   HCT 48.4 (H) 36.0 - 46.0 %   MCV 97.0 80.0 - 100.0 fL   MCH 31.7 26.0 - 34.0 pg   MCHC 32.6 30.0 - 36.0 g/dL   RDW  14.0 11.5 - 15.5 %   Platelets 397 150 - 400 K/uL   nRBC 0.0 0.0 - 0.2 %    Comment: Performed at [facility], [place] [address] [place], [place] [address]  Basic metabolic panel per protocol     Status: Abnormal   Collection Time: 12/19/21 10:45 AM  Result Value Ref Range    Sodium 137 135 - 145 mmol/L   Potassium 3.6 3.5 - 5.1 mmol/L   Chloride 103 98 - 111 mmol/L   CO2 24 22 - 32 mmol/L   Glucose, Bld 194 (H) 70 - 99 mg/dL    Comment: Glucose reference range applies only to samples taken after fasting for at least 8 hours.   BUN 8 6 - 20 mg/dL   Creatinine, Ser 0.75 0.44 - 1.00 mg/dL   Calcium 9.2 8.9 - 10.3 mg/dL   GFR, Estimated >60 >60 mL/min    Comment: (NOTE) Calculated using the CKD-EPI Creatinine Equation (2021)    Anion gap 10 5 - 15    Comment: Performed at [facility] [address], [place], [place] [address]  Hemoglobin A1c per protocol     Status: Abnormal   Collection Time: 12/19/21 10:45 AM  Result Value Ref Range   Hgb A1c MFr Bld 7.5 (H) 4.8 - 5.6 %    Comment: (NOTE) Pre diabetes:          5.7%-6.4%  Diabetes:              >6.4%  Glycemic control for   <7.0% adults with diabetes    Mean Plasma Glucose 168.55 mg/dL    Comment: Performed at [facility] [address], [place], [place] [address]  C-reactive protein     Status: Abnormal   Collection Time: 02/21/22  9:08 AM  Result Value Ref Range   CRP 1.1 (H) <1.0 mg/dL    Comment: Performed at [facility] [address], [place], [place] [address]  Sedimentation rate     Status: Abnormal   Collection Time: 02/21/22  9:08 AM  Result Value Ref Range   Sed Rate 24 (H) 0 - 22 mm/hr    Comment: Performed at [facility], [address], [place], [place] [address]  Lactate dehydrogenase     Status: None   Collection Time: 02/21/22  9:08 AM  Result Value Ref Range   LDH 112 98 - 192 U/L    Comment: Performed at [facility], [address], [place], [place] [address]  Reticulocytes     Status: Abnormal   Collection Time: 02/21/22  9:08 AM  Result Value Ref Range   Retic Ct Pct 1.7 0.4 - 3.1 %   RBC. 4.87 3.87 - 5.11 MIL/uL   Retic Count, Absolute 82.8 19.0 - 186.0 K/uL   Immature Retic Fract 16.7 (H) 2.3 - 15.9 %    Comment: Performed at [facility], [address] [place], [place] [address]  CBC with Differential     Status: Abnormal   Collection Time: 02/21/22  9:08 AM  Result Value Ref Range   WBC 14.8 (H) 4.0 - 10.5 K/uL   RBC 4.95 3.87 - 5.11 MIL/uL   Hemoglobin 15.6 (H) 12.0 - 15.0 g/dL   HCT 47.1 (H) 36.0 - 46.0 %   MCV 95.2 80.0 - 100.0 fL   MCH 31.5 26.0 - 34.0 pg   MCHC 33.1 30.0 - 36.0 g/dL   RDW 14.2 11.5 - 15.5 %  Platelets 404 (H) 150 - 400 K/uL   nRBC 0.0 0.0 - 0.2 %   Neutrophils Relative % 74 %   Neutro Abs 11.0 (H) 1.7 - 7.7 K/uL   Lymphocytes Relative 20 %   Lymphs Abs 2.9 0.7 - 4.0 K/uL   Monocytes Relative 4 %   Monocytes Absolute 0.7 0.1 - 1.0 K/uL   Eosinophils Relative 1 %   Eosinophils Absolute 0.1 0.0 - 0.5 K/uL   Basophils Relative 0 %   Basophils Absolute 0.0 0.0 - 0.1 K/uL   Immature Granulocytes 1 %   Abs Immature Granulocytes 0.07 0.00 - 0.07 K/uL    Comment: Performed at [facility], [address] [place], [place] [address]    RADIOGRAPHIC STUDIES: I have personally reviewed the radiological images as listed and agreed with the findings in the report. No results found.  ASSESSMENT:  1.  Leukocytosis: - Patient seen at the request of Dr. Gar Ponto - CBC (01/10/2022): WBC-13.7, Hb-15.2, MCV-92.9, PLT-343, ANC-10.5, ALC low at 2.5 - CBC (01/01/2022): WBC-14.6, Hb-15.3, PLT-339, ANC-11.4, ALC low at 2.4.  She was treated with doxycycline. - CBC (12/27/2021): WBC-13.1, Hb-15.5, PLT-351, ANC-10.1, ALC low at 2.3 - 12/24/2021 urine culture: Mixed flora less than 10,000 colonies per mL - CBC (12/23/2021): WBC 15.9, ANC 12.2, AMC 1.0 - Review of epic shows elevated WBC since 2014 - She has recurrent UTIs and on Macrodantin 50 mg daily for the last 1 year.  She follows up with Dr. Alyson Ingles. - She took prednisone orally last time in August 2023. - She reports some night sweats but denies any fevers or weight loss.  2.  Social/family history: - Lives at home with husband.  She is  independent of ADLs and IADLs. - Brother had leukemia. - Sister had breast cancer at age 85. - Father had prostate cancer.  Another sister died of brain tumor.  3.  Rheumatoid arthritis: - Predominantly affects hands, ankles and knees.  She also has fibromyalgia. - She is on Orencia for 4 years.  PLAN:  1.  Neutrophilic leukocytosis: - We discussed various etiologies of leukocytosis.  Most likely reactive leukocytosis.  She does not report any signs of UTI at this time. - She is planning to have back surgery after workup of leukocytosis. - Will repeat CBC today.  Will check for inflammatory markers including ESR, CRP. - Will check myeloproliferative neoplasms with JAK2 [id number] and reflex testing.  Will also check BCR/ABL by FISH. - RTC 3 weeks for follow-up.   All questions were answered. The patient knows to call the clinic with any problems, questions or concerns.      Derek Jack, MD 02/21/22 5:19 PM

## 2022-02-21 NOTE — Patient Instructions (Addendum)
[facility] at [facility] Discharge Instructions   You were seen and examined today by Dr. Ellin Saba. He is a blood specialist who is see you today at the request of your primary care doctor for elevated white blood cell count.   We will obtain additional blood work today to investigate this further.   Return as scheduled in 1-2 weeks to review today's lab results.    Thank you for choosing  [facility] at [facility] to provide your oncology and hematology care.  To afford each patient quality time with our provider, please arrive at least 15 minutes before your scheduled appointment time.   If you have a lab appointment with the Cancer Center please come in thru the Main Entrance and check in at the main information desk.  You need to re-schedule your appointment should you arrive 10 or more minutes late.  We strive to give you quality time with our providers, and arriving late affects you and other patients whose appointments are after yours.  Also, if you no show three or more times for appointments you may be dismissed from the clinic at the providers discretion.     Again, thank you for choosing [facility].  Our hope is that these requests will decrease the amount of time that you wait before being seen by our physicians.       _____________________________________________________________  Should you have questions after your visit to [facility], please contact our office at [phone] and follow the prompts.  Our office hours are 8:00 a.m. and 4:30 p.m. Monday - Friday.  Please note that voicemails left after 4:00 p.m. may not be returned until the following business day.  We are closed weekends and major holidays.  You do have access to a nurse 24-7, just call the main number to the clinic [phone] and do not press any options, hold on the line and a nurse will answer the phone.    For prescription refill  requests, have your pharmacy contact our office and allow 72 hours.    Due to Covid, you will need to wear a mask upon entering the hospital. If you do not have a mask, a mask will be given to you at the Main Entrance upon arrival. For doctor visits, patients may have 1 support person age 67 or older with them. For treatment visits, patients can not have anyone with them due to social distancing guidelines and our immunocompromised population.

## 2022-02-24 LAB — ANTINUCLEAR ANTIBODIES, IFA: ANA Ab, IFA: NEGATIVE

## 2022-02-26 LAB — BCR-ABL1 FISH
Cells Analyzed: 200
Cells Counted: 200

## 2022-02-27 LAB — JAK2 V617F RFX CALR/MPL/E12-15

## 2022-02-27 LAB — CALR +MPL + E12-E15  (REFLEX)

## 2022-03-18 ENCOUNTER — Other Ambulatory Visit: Payer: Managed Care, Other (non HMO)

## 2022-03-18 ENCOUNTER — Ambulatory Visit: Payer: Managed Care, Other (non HMO) | Admitting: Hematology

## 2022-04-04 DIAGNOSIS — M5416 Radiculopathy, lumbar region: Secondary | ICD-10-CM | POA: Diagnosis not present

## 2022-04-08 NOTE — Progress Notes (Signed)
[facility] [address], [place] [address]   CLINIC:  Medical Oncology/Hematology  PCP:  Stephanie Chimera, MD [address] [place] [address] [phone]   REASON FOR VISIT:  Follow-up for leukocytosis  PRIOR THERAPY: None  CURRENT THERAPY: Under workup  INTERVAL HISTORY:   Stephanie Huang 57 y.o. female returns for routine follow-up of leukocytosis.  She was seen for initial consultation by Dr. Ellin Huang on 02/21/2022.  At today's visit, she reports feeling fair.  She denies any changes in her symptoms or baseline health status since her initial consultation with Dr. Ellin Huang last month.  She continues to report history of recurrent UTIs (on Macrodantin 50 mg daily for the past year, following with Dr. Ronne Huang).  She took oral prednisone in August 2023 for an RA flare.  She reports some night sweats, but denies any fevers or unintentional weight loss.  She is on Orencia for rheumatoid arthritis for the last 4 years.  She also has fibromyalgia. She denies any new lumps or bumps.  Patient has been smoking 1 PPD since age 77.  No history of splenectomy.  She has little to no energy and 50% appetite. She endorses that she is maintaining a stable weight.  ASSESSMENT & PLAN:  1.  Neutrophilic leukocytosis - Review of Epic shows elevated WBCs since 2014 (with limited data), with WBC peaking at 18.9 in September 2023 (after completing course of prednisone); primarily neutrophilic - History of recurrent UTIs, following with urology (Dr. Ronne Huang) and on daily Macrodantin - Last steroids were prednisone in August 2023 - Reports night sweats, but denies fevers or unintentional weight loss - Takes Orencia for rheumatoid arthritis.  Also has fibromyalgia.  No prior splenectomy. - Current everyday smoker, 1 PPD x 40 years (since age 43) - Hematology workup (02/21/2022): Negative BCR/ABL FISH, JAK2, CALR, MPL Mildly elevated CRP 1.1, mildly elevated ESR 24.  Negative ANA.   Normal LDH.  Normal reticulocytes. - Most recent CBC (02/21/2022): WBC 14.8/ANC 11.0, platelets 404, Hgb 15.6/hematocrit 47.1 - No lymphadenopathy or palpable splenomegaly on exam today - PLAN: Differential diagnosis favors reactive leukocytosis in the setting of rheumatoid arthritis, fibromyalgia, smoking, and obesity. - Note is also made of mildly elevated hemoglobin/hematocrit, which may be due to her underlying sleep apnea.   - We will check labs only (CBC/D + erythropoietin + carbon monoxide) in 2 months. - Labs (CBC/D) followed by PHONE visit in 4 months  2.  Rheumatoid arthritis - Predominantly affects hands, ankles, and knees - She also has fibromyalgia - She has been taking Orencia since 2019  3.  Tobacco use - Current everyday smoker, 1 PPD since age 73 - She receives annual LDCT lung cancer screening via [facility], most recently done 01/24/2022 (lung RADS 1, negative)  4.  Other history - PMH: Anxiety/depression, fibromyalgia, GERD, headache, hypertension, IBS, rheumatoid arthritis, sleep apnea - Lives at home with husband.  She is independent of ADLs and IADLs. - Brother had leukemia. - Sister had breast cancer at age 21. - Father had prostate cancer.  Another sister died of brain tumor.  PLAN SUMMARY: >> Labs only in 2 months (CBC/D) >> Labs (CBC/D, erythropoietin, carbon monoxide) in 4 months >> PHONE visit in 4 months, 1 week after labs    REVIEW OF SYSTEMS:   Review of Systems  Constitutional:  Positive for fatigue. Negative for appetite change, chills, diaphoresis, fever and unexpected weight change.  HENT:   Negative for lump/mass and nosebleeds.  Eyes:  Negative for eye problems.  Respiratory:  Negative for cough, hemoptysis and shortness of breath.   Cardiovascular:  Negative for chest pain, leg swelling and palpitations.  Gastrointestinal:  Positive for nausea. Negative for abdominal pain, blood in stool, constipation, diarrhea and vomiting.  Genitourinary:   Negative for hematuria.   Musculoskeletal:  Positive for arthralgias and back pain.  Skin: Negative.   Neurological:  Positive for headaches. Negative for dizziness and light-headedness.  Hematological:  Does not bruise/bleed easily.  Psychiatric/Behavioral:  Positive for depression and sleep disturbance. The patient is nervous/anxious.      PHYSICAL EXAM:  ECOG PERFORMANCE STATUS: 2 - Symptomatic, <50% confined to bed  There were no vitals filed for this visit. There were no vitals filed for this visit. Physical Exam Constitutional:      Appearance: Normal appearance. She is obese.  HENT:     Head: Normocephalic and atraumatic.     Mouth/Throat:     Mouth: Mucous membranes are moist.  Eyes:     Extraocular Movements: Extraocular movements intact.     Pupils: Pupils are equal, round, and reactive to light.  Cardiovascular:     Rate and Rhythm: Normal rate and regular rhythm.     Pulses: Normal pulses.     Heart sounds: Normal heart sounds.  Pulmonary:     Effort: Pulmonary effort is normal.     Breath sounds: Normal breath sounds.  Abdominal:     General: Bowel sounds are normal.     Palpations: Abdomen is soft.     Tenderness: There is no abdominal tenderness.  Musculoskeletal:        General: No swelling.     Right lower leg: No edema.     Left lower leg: No edema.  Lymphadenopathy:     Cervical: No cervical adenopathy.  Skin:    General: Skin is warm and dry.  Neurological:     General: No focal deficit present.     Mental Status: She is alert and oriented to person, place, and time.  Psychiatric:        Mood and Affect: Mood normal.        Behavior: Behavior normal.     PAST MEDICAL/SURGICAL HISTORY:  Past Medical History:  Diagnosis Date   Anxiety    Depression    Fibromyalgia    Fibromyalgia syndrome    GERD (gastroesophageal reflux disease)    Headache(784.0)    Mirgraine- January 2014- last one   History of blood transfusion    after C- Section    Hypertension    IBS (irritable bowel syndrome)    Left knee DJD    Sleep apnea    Diagnosed 5 years ago   Past Surgical History:  Procedure Laterality Date   CESAREAN SECTION     KNEE ARTHROSCOPY Left 09/2011   PARTIAL KNEE ARTHROPLASTY Left 05/10/2012   Procedure: UNICOMPARTMENTAL KNEE;  Surgeon: Lorn Junes, MD;  Location: [place];  Service: Orthopedics;  Laterality: Left;   TUBAL LIGATION     VAGINAL HYSTERECTOMY      SOCIAL HISTORY:  Social History   Socioeconomic History   Marital status: Married    Spouse name: Not on file   Number of children: Not on file   Years of education: Not on file   Highest education level: Not on file  Occupational History   Occupation: disabled  Tobacco Use   Smoking status: Every Day    Packs/day: 1.00  Years: 30.00    Total pack years: 30.00    Types: Cigarettes    Last attempt to quit: 03/05/2012    Years since quitting: 10.0   Smokeless tobacco: Never  Substance and Sexual Activity   Alcohol use: No   Drug use: No   Sexual activity: Yes    Birth control/protection: Surgical  Other Topics Concern   Not on file  Social History Narrative   Not on file   Social Determinants of Health   Financial Resource Strain: Not on file  Food Insecurity: Not on file  Transportation Needs: Not on file  Physical Activity: Not on file  Stress: Not on file  Social Connections: Not on file  Intimate Partner Violence: Not on file    FAMILY HISTORY:  Family History  Problem Relation Age of Onset   Cancer Father    Pneumonia Mother     CURRENT MEDICATIONS:  Outpatient Encounter Medications as of 04/09/2022  Medication Sig   Abatacept 125 MG/ML SOSY Inject 125 mg into the skin every 7 (seven) days.   acetaminophen (TYLENOL) 325 MG tablet Take 2 tablets (650 mg total) by mouth every 6 (six) hours as needed for pain or fever.   albuterol (VENTOLIN HFA) 108 (90 Base) MCG/ACT inhaler Inhale 1-2 puffs into the lungs every 6 (six) hours as  needed for wheezing or shortness of breath.   ANORO ELLIPTA 62.5-25 MCG/INH AEPB Inhale 1 puff into the lungs daily.   aspirin 81 MG chewable tablet Chew 81 mg by mouth daily.   atorvastatin (LIPITOR) 40 MG tablet Take by mouth.   bisacodyl (DULCOLAX) 5 MG EC tablet Take 2 tablets every night with dinner until bowel movement.  LAXITIVE.  Restart if two days since last bowel movement   buPROPion (WELLBUTRIN XL) 150 MG 24 hr tablet Take 150 mg by mouth in the morning and at bedtime.   celecoxib (CELEBREX) 200 MG capsule Take 1 capsule (200 mg total) by mouth daily.   citalopram (CELEXA) 20 MG tablet Take 20 mg by mouth daily.    clobetasol ointment (TEMOVATE) 6.44 % Apply 1 Application topically daily as needed (rash).   cyclobenzaprine (FLEXERIL) 10 MG tablet Take 10 mg by mouth 3 (three) times daily.   diclofenac (VOLTAREN) 75 MG EC tablet Take 75 mg by mouth 2 (two) times daily.   docusate sodium 100 MG CAPS 1 tab 2 times a day while on narcotics.  STOOL SOFTENER   DULoxetine (CYMBALTA) 60 MG capsule Take 60 mg by mouth daily.   fluconazole (DIFLUCAN) 150 MG tablet Take 1 tablet (150 mg total) by mouth daily. (Patient taking differently: Take 150 mg by mouth daily as needed (yeast).)   folic acid (FOLVITE) 1 MG tablet Take 1 mg by mouth daily.   gabapentin (NEURONTIN) 600 MG tablet Take 600 mg by mouth 3 (three) times daily.   HYDROcodone-acetaminophen (NORCO/VICODIN) 5-325 MG tablet Take 1 tablet by mouth 4 (four) times daily.   hydrocortisone 2.5 % cream Apply 1 Application topically daily as needed (irritation).   JARDIANCE 25 MG TABS tablet Take 25 mg by mouth daily.   lisinopril-hydrochlorothiazide (PRINZIDE,ZESTORETIC) 20-12.5 MG per tablet Take 1 tablet by mouth daily.   metFORMIN (GLUCOPHAGE-XR) 500 MG 24 hr tablet Take 500 mg by mouth 2 (two) times daily with a meal.   metoprolol succinate (TOPROL-XL) 25 MG 24 hr tablet Take 25 mg by mouth daily.   MOUNJARO 7.5 MG/0.5ML Pen Inject 7.5  mg into the skin once  a week.   nitrofurantoin (MACRODANTIN) 50 MG capsule Take 50 mg by mouth at bedtime.   norethindrone (AYGESTIN) 5 MG tablet Take 5 mg by mouth daily.   ofloxacin (OCUFLOX) 0.3 % ophthalmic solution Place 1 drop into the right eye daily.   omeprazole (PRILOSEC) 20 MG capsule Take 20 mg by mouth daily.   oxyCODONE (OXY IR/ROXICODONE) 5 MG immediate release tablet 1-2 tablets every 4-6 hrs as needed for pain   OxyCODONE (OXYCONTIN) 20 mg T12A Take 1 tablet (20 mg total) by mouth every 12 (twelve) hours.   prednisoLONE acetate (PRED FORTE) 1 % ophthalmic suspension Place 1 drop into the right eye daily.   solifenacin (VESICARE) 10 MG tablet Take 1 tablet (10 mg total) by mouth daily.   sulfamethoxazole-trimethoprim (BACTRIM DS) 800-160 MG tablet Take 1 tablet by mouth 2 (two) times daily.   tacrolimus (PROGRAF) 1 MG capsule Take 1 mg by mouth See admin instructions. Dissolve 1 mg into a glass of water, swish and spit up to twice daily as needed for lichen planus sores.   topiramate (TOPAMAX) 100 MG tablet Take 100-200 mg by mouth daily.   trimethoprim (TRIMPEX) 100 MG tablet Take 1 tablet (100 mg total) by mouth at bedtime.   zolpidem (AMBIEN) 10 MG tablet Take 10 mg by mouth at bedtime.   No facility-administered encounter medications on file as of 04/09/2022.    ALLERGIES:  Allergies  Allergen Reactions   Methotrexate Derivatives Other (See Comments)    Blisters in mouth   Lyrica [Pregabalin] Nausea And Vomiting and Swelling    LABORATORY DATA:  I have reviewed the labs as listed.  CBC    Component Value Date/Time   WBC 14.8 (H) 02/21/2022 0908   RBC 4.87 02/21/2022 0908   RBC 4.95 02/21/2022 0908   HGB 15.6 (H) 02/21/2022 0908   HCT 47.1 (H) 02/21/2022 0908   PLT 404 (H) 02/21/2022 0908   MCV 95.2 02/21/2022 0908   MCH 31.5 02/21/2022 0908   MCHC 33.1 02/21/2022 0908   RDW 14.2 02/21/2022 0908   LYMPHSABS 2.9 02/21/2022 0908   MONOABS 0.7 02/21/2022 0908    EOSABS 0.1 02/21/2022 0908   BASOSABS 0.0 02/21/2022 0908      Latest Ref Rng & Units 12/19/2021   10:45 AM 05/15/2021    2:49 PM 05/11/2012    4:52 AM  CMP  Glucose 70 - 99 mg/dL 458  592  924   BUN 6 - 20 mg/dL 8  6  6    Creatinine 0.44 - 1.00 mg/dL  4.62  8.63   Sodium 135 - 145 mmol/L 137  142  139   Potassium 3.5 - 5.1 mmol/L 3.6  3.6  4.3   Chloride 98 - 111 mmol/L 103  103  107   CO2 22 - 32 mmol/L 24  26  22    Calcium 8.9 - 10.3 mg/dL 9.2  9.3  8.9     DIAGNOSTIC IMAGING:  I have independently reviewed the relevant imaging and discussed with the patient.   WRAP UP:  All questions were answered. The patient knows to call the clinic with any problems, questions or concerns.  Medical decision making: Low  Time spent on visit: I spent 15 minutes counseling the patient face to face. The total time spent in the appointment was 22 minutes and more than 50% was on counseling.  8.17, PA-C  04/09/22 9:21 AM

## 2022-04-09 ENCOUNTER — Other Ambulatory Visit: Payer: Self-pay

## 2022-04-09 ENCOUNTER — Other Ambulatory Visit: Payer: Self-pay | Admitting: Urology

## 2022-04-09 ENCOUNTER — Inpatient Hospital Stay: Payer: Managed Care, Other (non HMO) | Attending: Hematology | Admitting: Physician Assistant

## 2022-04-09 ENCOUNTER — Encounter: Payer: Self-pay | Admitting: Physician Assistant

## 2022-04-09 VITALS — BP 133/86 | HR 102 | Temp 97.6°F | Resp 20 | Wt 236.6 lb

## 2022-04-09 DIAGNOSIS — D72829 Elevated white blood cell count, unspecified: Secondary | ICD-10-CM | POA: Diagnosis not present

## 2022-04-09 DIAGNOSIS — G4730 Sleep apnea, unspecified: Secondary | ICD-10-CM | POA: Insufficient documentation

## 2022-04-09 DIAGNOSIS — F17210 Nicotine dependence, cigarettes, uncomplicated: Secondary | ICD-10-CM | POA: Insufficient documentation

## 2022-04-09 DIAGNOSIS — Z87440 Personal history of urinary (tract) infections: Secondary | ICD-10-CM | POA: Diagnosis not present

## 2022-04-09 DIAGNOSIS — M797 Fibromyalgia: Secondary | ICD-10-CM | POA: Diagnosis not present

## 2022-04-09 DIAGNOSIS — M069 Rheumatoid arthritis, unspecified: Secondary | ICD-10-CM | POA: Diagnosis not present

## 2022-04-09 DIAGNOSIS — Z79899 Other long term (current) drug therapy: Secondary | ICD-10-CM | POA: Diagnosis not present

## 2022-04-09 DIAGNOSIS — D729 Disorder of white blood cells, unspecified: Secondary | ICD-10-CM | POA: Diagnosis not present

## 2022-04-09 DIAGNOSIS — K219 Gastro-esophageal reflux disease without esophagitis: Secondary | ICD-10-CM | POA: Diagnosis not present

## 2022-04-09 DIAGNOSIS — Z7982 Long term (current) use of aspirin: Secondary | ICD-10-CM | POA: Diagnosis not present

## 2022-04-09 DIAGNOSIS — Z7984 Long term (current) use of oral hypoglycemic drugs: Secondary | ICD-10-CM | POA: Diagnosis not present

## 2022-04-09 DIAGNOSIS — K589 Irritable bowel syndrome without diarrhea: Secondary | ICD-10-CM | POA: Insufficient documentation

## 2022-04-09 DIAGNOSIS — Z806 Family history of leukemia: Secondary | ICD-10-CM | POA: Insufficient documentation

## 2022-04-09 DIAGNOSIS — Z803 Family history of malignant neoplasm of breast: Secondary | ICD-10-CM | POA: Diagnosis not present

## 2022-04-09 DIAGNOSIS — D72825 Bandemia: Secondary | ICD-10-CM

## 2022-04-09 DIAGNOSIS — N3021 Other chronic cystitis with hematuria: Secondary | ICD-10-CM

## 2022-04-09 DIAGNOSIS — I10 Essential (primary) hypertension: Secondary | ICD-10-CM | POA: Diagnosis not present

## 2022-04-09 DIAGNOSIS — Z791 Long term (current) use of non-steroidal anti-inflammatories (NSAID): Secondary | ICD-10-CM | POA: Insufficient documentation

## 2022-04-09 NOTE — Patient Instructions (Signed)
[facility] **   You were seen today by Tarri Abernethy PA-C for your high white blood cells.    HIGH WHITE BLOOD CELLS Your labs did NOT show any sign of cancerous cause of high white blood cells. Your high white blood cells are most likely reactive due to inflammation from your rheumatoid arthritis, fibromyalgia, obesity, and smoking.  TOBACCO USE Continue annual CT scan of your chest for lung cancer screening. Please see the attached handout for information on smoking cessation. You can find information about free smoking cessation classes online by looking up "[place]"  LABS: Return in 2 months for repeat labs  FOLLOW-UP APPOINTMENT: Labs and phone visit in 4 months  ** Thank you for trusting me with your healthcare!  I strive to provide all of my patients with quality care at each visit.  If you receive a survey for this visit, I would be so grateful to you for taking the time to provide feedback.  Thank you in advance!  ~ Brennley Curtice                   Dr. Derek Jack   &   Tarri Abernethy, PA-C   - - - - - - - - - - - - - - - - - -    Thank you for choosing [place] [facility] to provide your oncology and hematology care.  To afford each patient quality time with our provider, please arrive at least 15 minutes before your scheduled appointment time.   If you have a lab appointment with the [facility] please come in thru the Main Entrance and check in at the main information desk.  You need to re-schedule your appointment should you arrive 10 or more minutes late.  We strive to give you quality time with our providers, and arriving late affects you and other patients whose appointments are after yours.  Also, if you no show three or more times for appointments you may be dismissed from the clinic at the providers discretion.     Again, thank you for  choosing [facility].  Our hope is that these requests will decrease the amount of time that you wait before being seen by our physicians.       _____________________________________________________________  Should you have questions after your visit to [facility], please contact our office at [phone] and follow the prompts.  Our office hours are 8:00 a.m. and 4:30 p.m. Monday - Friday.  Please note that voicemails left after 4:00 p.m. may not be returned until the following business day.  We are closed weekends and major holidays.  You do have access to a nurse 24-7, just call the main number to the clinic [phone] and do not press any options, hold on the line and a nurse will answer the phone.    For prescription refill requests, have your pharmacy contact our office and allow 72 hours.

## 2022-04-21 DIAGNOSIS — R030 Elevated blood-pressure reading, without diagnosis of hypertension: Secondary | ICD-10-CM | POA: Diagnosis not present

## 2022-04-21 DIAGNOSIS — F17210 Nicotine dependence, cigarettes, uncomplicated: Secondary | ICD-10-CM | POA: Diagnosis not present

## 2022-04-21 DIAGNOSIS — R519 Headache, unspecified: Secondary | ICD-10-CM | POA: Diagnosis not present

## 2022-04-21 DIAGNOSIS — Z20828 Contact with and (suspected) exposure to other viral communicable diseases: Secondary | ICD-10-CM | POA: Diagnosis not present

## 2022-04-21 DIAGNOSIS — Z6838 Body mass index (BMI) 38.0-38.9, adult: Secondary | ICD-10-CM | POA: Diagnosis not present

## 2022-04-21 DIAGNOSIS — R6889 Other general symptoms and signs: Secondary | ICD-10-CM | POA: Diagnosis not present

## 2022-04-23 ENCOUNTER — Other Ambulatory Visit: Payer: Self-pay | Admitting: Urology

## 2022-04-24 ENCOUNTER — Encounter: Payer: Self-pay | Admitting: Internal Medicine

## 2022-05-14 IMAGING — CT CT ABD-PEL WO/W CM
4 of 12 series · 12 of 46 positions shown, 18 images · IV contrast (Omnipaque or Isovue)
Comparison: None available

CLINICAL DATA: Hematuria, UTI symptoms.

EXAM:
CT ABDOMEN AND PELVIS WITHOUT AND WITH CONTRAST
TECHNIQUE: Multidetector CT imaging of the abdomen and pelvis was performed
following the standard protocol before and following the bolus
administration of intravenous contrast.

[Series 3: axial pre · axial · non-contrast · 0.95mm/px · z∈[+898,+1023]mm · 2 of 99 slices shown]
[im 25/99  soft-tissue]
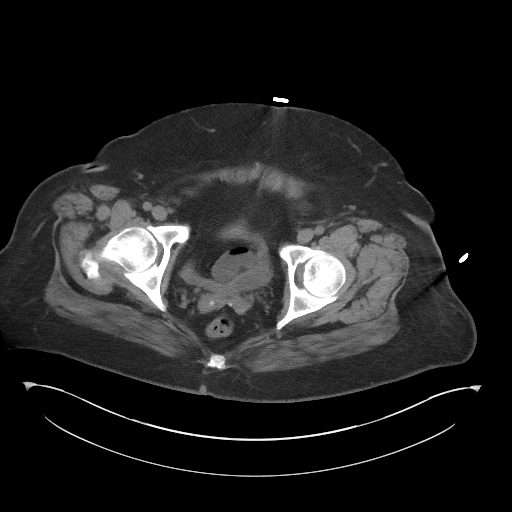
[im 50/99  soft-tissue]
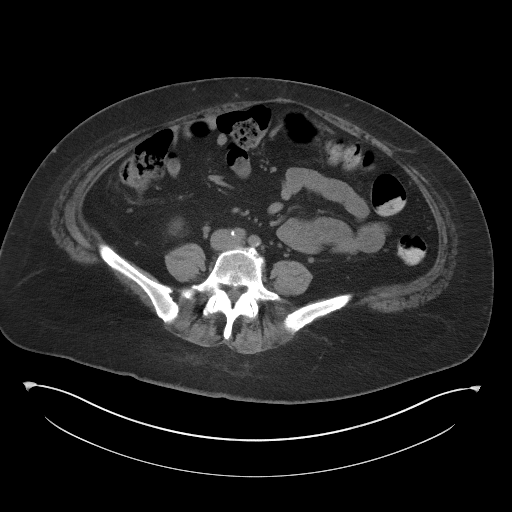

[Series 8: axial post · axial · 0.88mm/px · z∈[+873,+1168]mm · 4 of 99 slices shown, 9 images]
[im 20/99  soft-tissue]
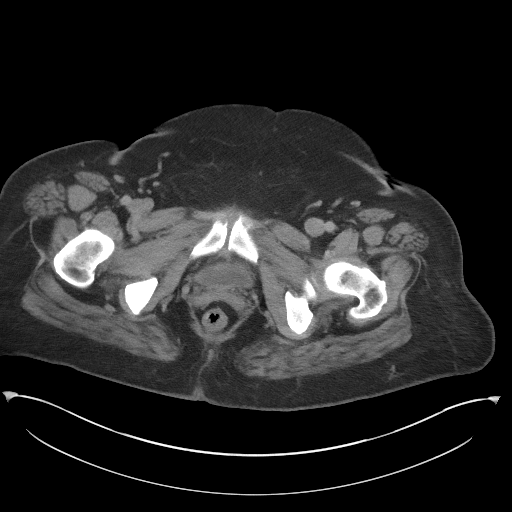
[im 20/99  lung]
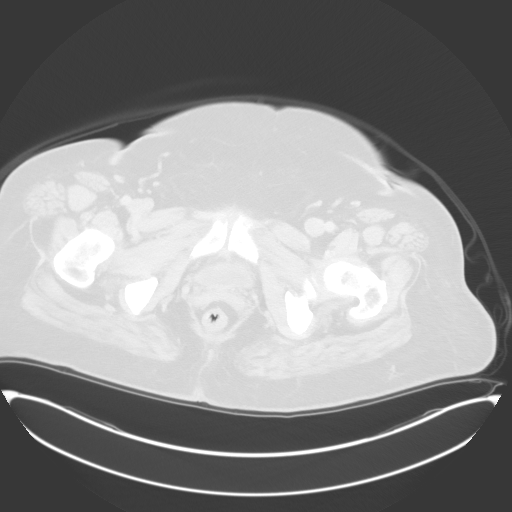
[im 20/99  bone]
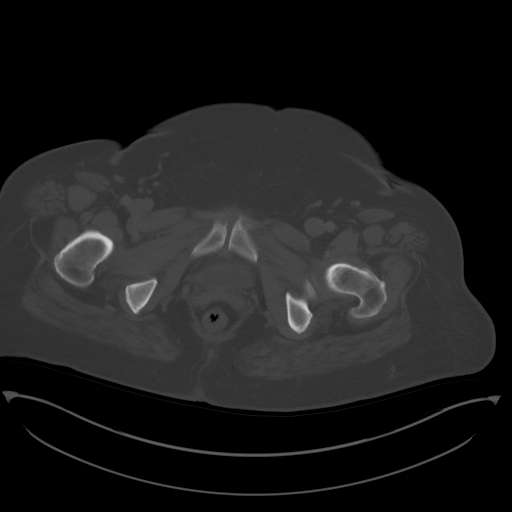
[im 40/99  soft-tissue]
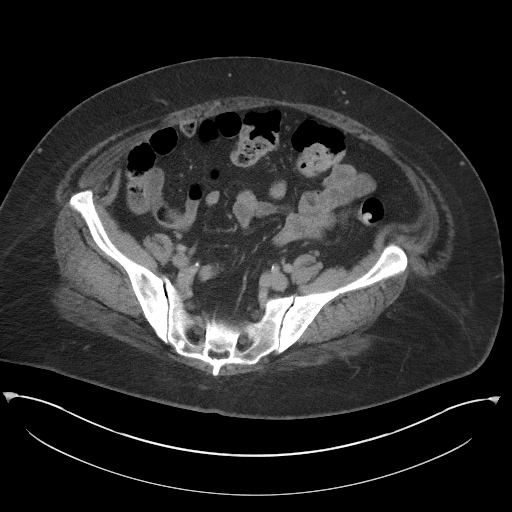
[im 40/99  lung]
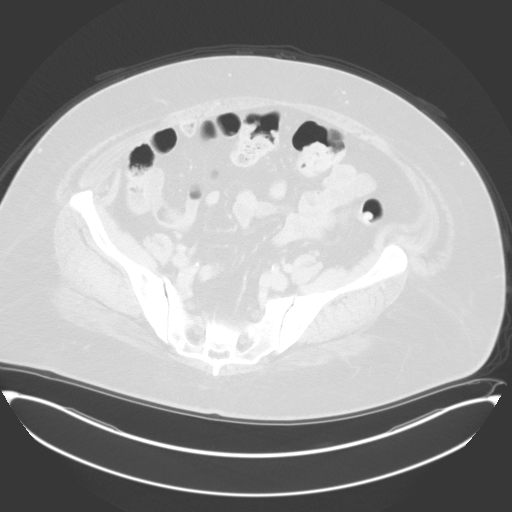
[im 59/99  soft-tissue]
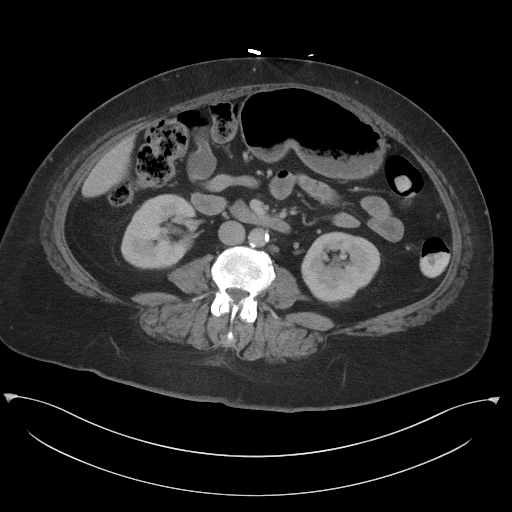
[im 59/99  lung]
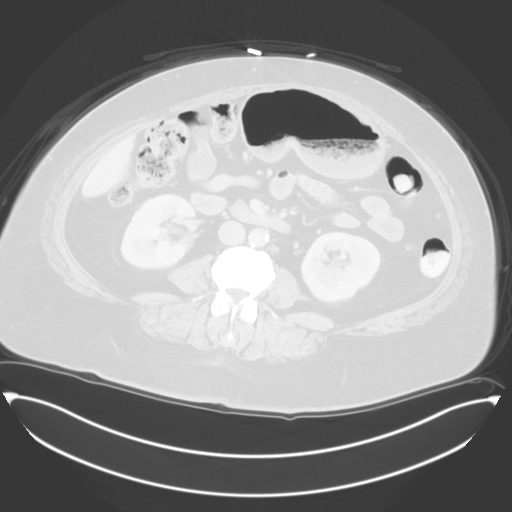
[im 79/99  soft-tissue]
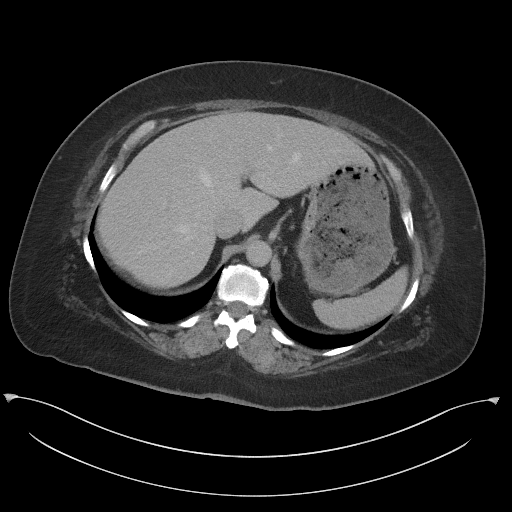
[im 79/99  lung]
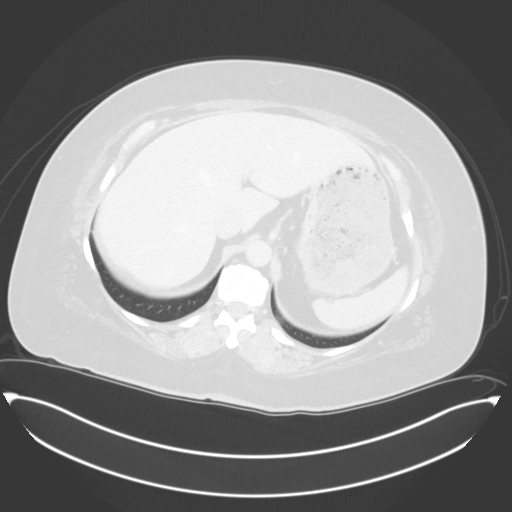

[Series 11: coronal post · coronal · 0.97mm/px · 2 of 114 slices shown, 3 images]
[im 38/114  soft-tissue]
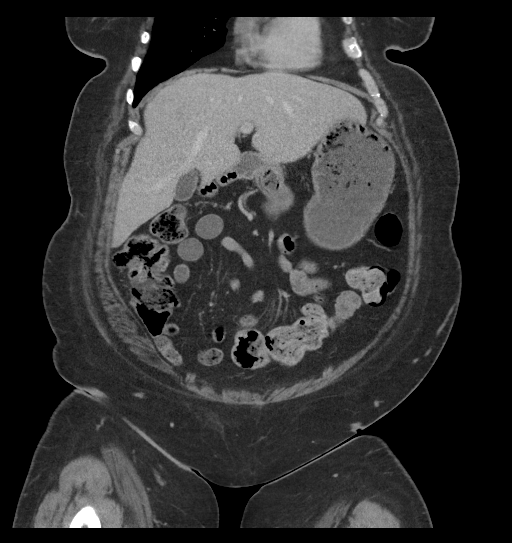
[im 38/114  bone]
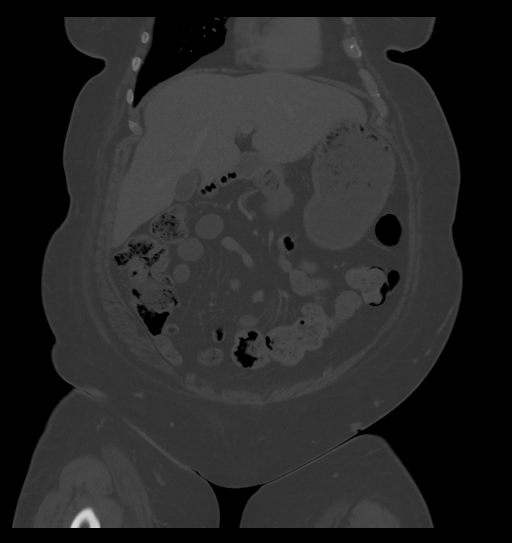
[im 76/114  soft-tissue]
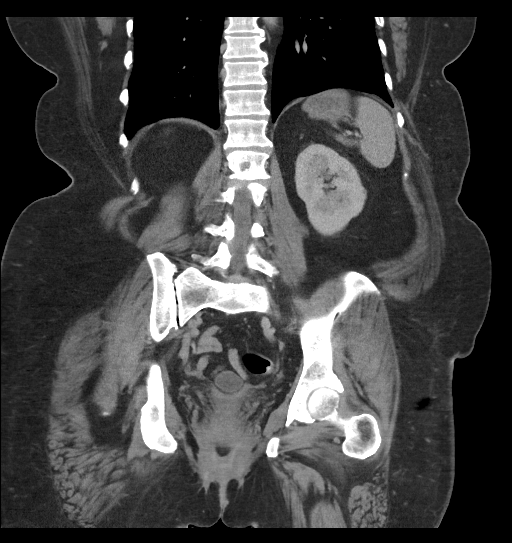

[Series 13: axial delay · axial · delayed · 0.96mm/px · z∈[+816,+1106]mm · 4 of 98 slices shown]
[im 20/98  soft-tissue]
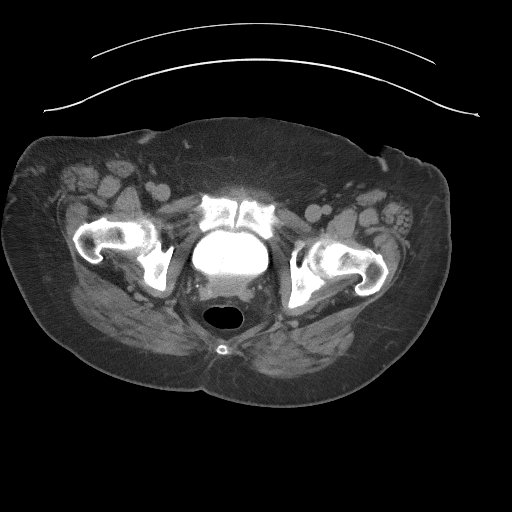
[im 39/98  soft-tissue]
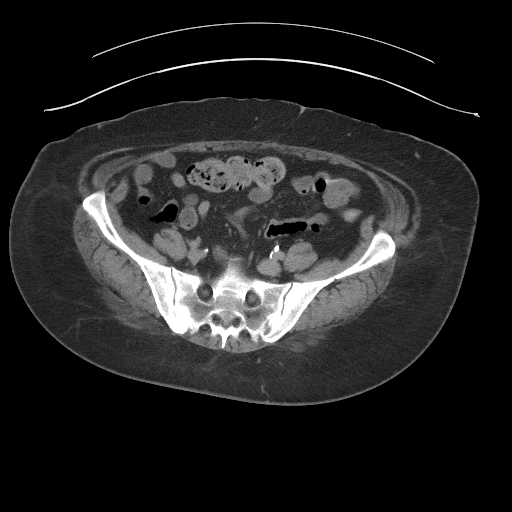
[im 59/98  soft-tissue]
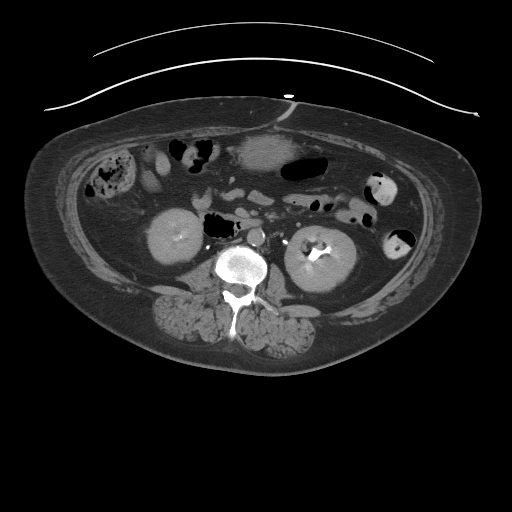
[im 78/98  soft-tissue]
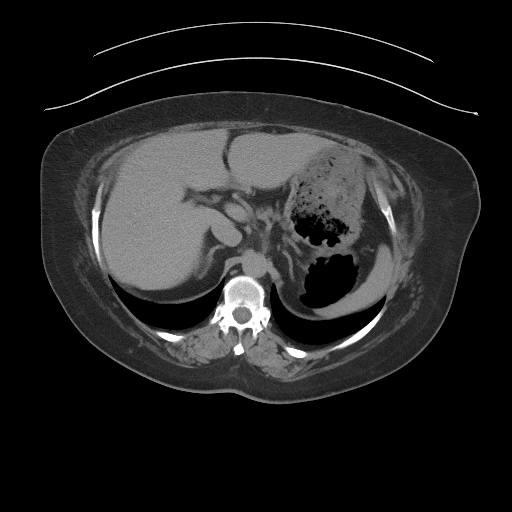

[12 of 46 positions shown; findings below may reference images not displayed]

RADIATION DOSE REDUCTION: This exam was performed according to the
departmental dose-optimization program which includes automated
exposure control, adjustment of the mA and/or kV according to
patient size and/or use of iterative reconstruction technique.

CONTRAST:  100mL OMNIPAQUE IOHEXOL 300 MG/ML  SOLN
FINDINGS: Lower chest: No acute abnormality.

Hepatobiliary: No suspicious hepatic lesion. Gallbladder is
unremarkable. No biliary ductal dilation.

Pancreas: No pancreatic ductal dilation or evidence of acute
inflammation.

Spleen: No splenomegaly or focal splenic lesion.

Adrenals/Urinary Tract: Bilateral adrenal glands appear normal.

No hydronephrosis. No renal, ureteral or bladder calculi identified.

No solid enhancing renal mass. Exophytic 15 mm right
upper/interpolar renal cyst. Additional hypodense renal lesions are
technically too small to accurately characterize but statistically
likely reflect cysts.

Kidneys demonstrate symmetric enhancement and excretion of contrast
material. No collecting system duplication. No suspicious filling
defect visualized within the opacified portions of the collecting
systems or ureters on delayed imaging. However, the distal right
ureter is not well opacified limiting evaluation.

Urinary bladder does not demonstrate abnormal wall thickening or
suspicious intraluminal filling defect. There is a small to moderate
size cystocele

Stomach/Bowel: No radiopaque enteric contrast material was
administered. Stomach is unremarkable for degree of distension. No
pathologic dilation of small or large bowel. The terminal ileum
appears normal. The appendix is not confidently identified however
there is no pericecal inflammation. No evidence of acute bowel
inflammation. Anorectal junction extends below the coccyx consistent
with a rectocele.

Vascular/Lymphatic: Aortic and branch vessel atherosclerosis without
abdominal aortic aneurysm. No pathologically enlarged abdominal or
pelvic lymph nodes.

Reproductive: The uterus is surgically absent. Vaginal prolapse. No
suspicious adnexal mass.

Other: No significant abdominopelvic free fluid. Pelvic floor
laxity.

Musculoskeletal: Multilevel degenerative changes spine. No acute
osseous abnormality.
IMPRESSION: 1. No renal, ureteral, or bladder calculi identified. No
hydronephrosis.
2. No solid enhancing renal mass.
3. Pelvic floor laxity with a small to moderate cystocele, vaginal
prolapse and a rectocele.
4. Aortic Atherosclerosis (JMETM-NL3.3).

## 2022-05-15 DIAGNOSIS — R030 Elevated blood-pressure reading, without diagnosis of hypertension: Secondary | ICD-10-CM | POA: Diagnosis not present

## 2022-05-15 DIAGNOSIS — F17210 Nicotine dependence, cigarettes, uncomplicated: Secondary | ICD-10-CM | POA: Diagnosis not present

## 2022-05-15 DIAGNOSIS — Z6839 Body mass index (BMI) 39.0-39.9, adult: Secondary | ICD-10-CM | POA: Diagnosis not present

## 2022-05-15 DIAGNOSIS — Z20828 Contact with and (suspected) exposure to other viral communicable diseases: Secondary | ICD-10-CM | POA: Diagnosis not present

## 2022-05-15 DIAGNOSIS — R6889 Other general symptoms and signs: Secondary | ICD-10-CM | POA: Diagnosis not present

## 2022-05-15 DIAGNOSIS — J441 Chronic obstructive pulmonary disease with (acute) exacerbation: Secondary | ICD-10-CM | POA: Diagnosis not present

## 2022-05-26 NOTE — Progress Notes (Unsigned)
GI Office Note    Referring Provider: Caryl Bis, MD Primary Care Physician:  Caryl Bis, MD  Primary Gastroenterologist:  Chief Complaint   No chief complaint on file.    History of Present Illness   Stephanie Huang is a 57 y.o. female presenting today at the request of Dr. Karlyn Agee for colonoscopy and dysphagia. Patient has history of tubular adenomas (2018) and due for surveillance exam and given history of ongoing solid/liquid dysphagia she was referred to GI.        Medications   Current Outpatient Medications  Medication Sig Dispense Refill   Abatacept 125 MG/ML SOSY Inject 125 mg into the skin every 7 (seven) days.     acetaminophen (TYLENOL) 325 MG tablet Take 2 tablets (650 mg total) by mouth every 6 (six) hours as needed for pain or fever.     albuterol (VENTOLIN HFA) 108 (90 Base) MCG/ACT inhaler Inhale 1-2 puffs into the lungs every 6 (six) hours as needed for wheezing or shortness of breath.     ANORO ELLIPTA 62.5-25 MCG/INH AEPB Inhale 1 puff into the lungs daily.     aspirin 81 MG chewable tablet Chew 81 mg by mouth daily.     atorvastatin (LIPITOR) 40 MG tablet Take by mouth.     bisacodyl (DULCOLAX) 5 MG EC tablet Take 2 tablets every night with dinner until bowel movement.  LAXITIVE.  Restart if two days since last bowel movement 30 tablet    buPROPion (WELLBUTRIN XL) 150 MG 24 hr tablet Take 150 mg by mouth in the morning and at bedtime.     celecoxib (CELEBREX) 200 MG capsule Take 1 capsule (200 mg total) by mouth daily. 30 capsule 0   citalopram (CELEXA) 20 MG tablet Take 20 mg by mouth daily.      clobetasol ointment (TEMOVATE) [id number] % Apply 1 Application topically daily as needed (rash).     cyclobenzaprine (FLEXERIL) 10 MG tablet Take 10 mg by mouth 3 (three) times daily.     diclofenac (VOLTAREN) 75 MG EC tablet Take 75 mg by mouth 2 (two) times daily.     docusate sodium 100 MG CAPS 1 tab 2 times a day while on narcotics.   STOOL SOFTENER 60 capsule 0   DULoxetine (CYMBALTA) 60 MG capsule Take 60 mg by mouth daily.     fluconazole (DIFLUCAN) 150 MG tablet Take 1 tablet (150 mg total) by mouth daily. (Patient taking differently: Take 150 mg by mouth daily as needed (yeast).) 2 tablet 5   folic acid (FOLVITE) 1 MG tablet Take 1 mg by mouth daily.     gabapentin (NEURONTIN) 600 MG tablet Take 600 mg by mouth 3 (three) times daily.     HYDROcodone-acetaminophen (NORCO/VICODIN) 5-325 MG tablet Take 1 tablet by mouth 4 (four) times daily.     hydrocortisone 2.5 % cream Apply 1 Application topically daily as needed (irritation).     JARDIANCE 25 MG TABS tablet Take 25 mg by mouth daily.     lisinopril-hydrochlorothiazide (PRINZIDE,ZESTORETIC) 20-12.5 MG per tablet Take 1 tablet by mouth daily.     metFORMIN (GLUCOPHAGE-XR) 500 MG 24 hr tablet Take 500 mg by mouth 2 (two) times daily with a meal.     metoprolol succinate (TOPROL-XL) 25 MG 24 hr tablet Take 25 mg by mouth daily.     MOUNJARO 7.5 MG/0.5ML Pen Inject 7.5 mg into the skin once a week.     nitrofurantoin (  MACRODANTIN) 50 MG capsule TAKE ONE CAPSULE BY MOUTH AT BEDTIME 90 capsule 3   norethindrone (AYGESTIN) 5 MG tablet Take 5 mg by mouth daily.     ofloxacin (OCUFLOX) 0.3 % ophthalmic solution Place 1 drop into the right eye daily.     omeprazole (PRILOSEC) 20 MG capsule Take 20 mg by mouth daily.     oxyCODONE (OXY IR/ROXICODONE) 5 MG immediate release tablet 1-2 tablets every 4-6 hrs as needed for pain 100 tablet 0   OxyCODONE (OXYCONTIN) 20 mg T12A Take 1 tablet (20 mg total) by mouth every 12 (twelve) hours. 30 tablet 0   prednisoLONE acetate (PRED FORTE) 1 % ophthalmic suspension Place 1 drop into the right eye daily.     solifenacin (VESICARE) 10 MG tablet Take 1 tablet (10 mg total) by mouth daily. 30 tablet 1   sulfamethoxazole-trimethoprim (BACTRIM DS) 800-160 MG tablet Take 1 tablet by mouth 2 (two) times daily. 10 tablet 0   tacrolimus (PROGRAF) 1 MG  capsule Take 1 mg by mouth See admin instructions. Dissolve 1 mg into a glass of water, swish and spit up to twice daily as needed for lichen planus sores.     topiramate (TOPAMAX) 100 MG tablet Take 100-200 mg by mouth daily.     trimethoprim (TRIMPEX) 100 MG tablet TAKE 1 TABLET BY MOUTH AT BEDTIME 30 tablet 3   zolpidem (AMBIEN) 10 MG tablet Take 10 mg by mouth at bedtime.     No current facility-administered medications for this visit.    Allergies   Allergies as of 05/27/2022 - Review Complete 04/09/2022  Allergen Reaction Noted   Methotrexate derivatives Other (See Comments) 04/01/2017   Lyrica [pregabalin] Nausea And Vomiting and Swelling 04/27/2012    Past Medical History   Past Medical History:  Diagnosis Date   Anxiety    Depression    Fibromyalgia    Fibromyalgia syndrome    GERD (gastroesophageal reflux disease)    Headache(784.0)    Mirgraine- January 2014- last one   History of blood transfusion    after C- Section   Hypertension    IBS (irritable bowel syndrome)    Left knee DJD    Sleep apnea    Diagnosed 5 years ago    Past Surgical History   Past Surgical History:  Procedure Laterality Date   CESAREAN SECTION     KNEE ARTHROSCOPY Left 09/2011   PARTIAL KNEE ARTHROPLASTY Left 05/10/2012   Procedure: UNICOMPARTMENTAL KNEE;  Surgeon: Lorn Junes, MD;  Location: [facility];  Service: Orthopedics;  Laterality: Left;   TUBAL LIGATION     VAGINAL HYSTERECTOMY      Past Family History   Family History  Problem Relation Age of Onset   Cancer Father    Pneumonia Mother     Past Social History   Social History   Socioeconomic History   Marital status: Married    Spouse name: Not on file   Number of children: Not on file   Years of education: Not on file   Highest education level: Not on file  Occupational History   Occupation: disabled  Tobacco Use   Smoking status: Every Day    Packs/day: 1.00    Years: 30.00    Total pack years: 30.00     Types: Cigarettes    Last attempt to quit: 03/05/2012    Years since quitting: 10.2   Smokeless tobacco: Never  Substance and Sexual Activity   Alcohol use: No  Drug use: No   Sexual activity: Yes    Birth control/protection: Surgical  Other Topics Concern   Not on file  Social History Narrative   Not on file   Social Determinants of Health   Financial Resource Strain: Not on file  Food Insecurity: Not on file  Transportation Needs: Not on file  Physical Activity: Not on file  Stress: Not on file  Social Connections: Not on file  Intimate Partner Violence: Not on file    Review of Systems   General: Negative for anorexia, weight loss, fever, chills, fatigue, weakness. Eyes: Negative for vision changes.  ENT: Negative for hoarseness, difficulty swallowing , nasal congestion. CV: Negative for chest pain, angina, palpitations, dyspnea on exertion, peripheral edema.  Respiratory: Negative for dyspnea at rest, dyspnea on exertion, cough, sputum, wheezing.  GI: See history of present illness. GU:  Negative for dysuria, hematuria, urinary incontinence, urinary frequency, nocturnal urination.  MS: Negative for joint pain, low back pain.  Derm: Negative for rash or itching.  Neuro: Negative for weakness, abnormal sensation, seizure, frequent headaches, memory loss,  confusion.  Psych: Negative for anxiety, depression, suicidal ideation, hallucinations.  Endo: Negative for unusual weight change.  Heme: Negative for bruising or bleeding. Allergy: Negative for rash or hives.  Physical Exam   There were no vitals taken for this visit.   General: Well-nourished, well-developed in no acute distress.  Head: Normocephalic, atraumatic.   Eyes: Conjunctiva pink, no icterus. Mouth: Oropharyngeal mucosa moist and pink , no lesions erythema or exudate. Neck: Supple without thyromegaly, masses, or lymphadenopathy.  Lungs: Clear to auscultation bilaterally.  Heart: Regular rate and  rhythm, no murmurs rubs or gallops.  Abdomen: Bowel sounds are normal, nontender, nondistended, no hepatosplenomegaly or masses,  no abdominal bruits or hernia, no rebound or guarding.   Rectal: *** Extremities: No lower extremity edema. No clubbing or deformities.  Neuro: Alert and oriented x 4 , grossly normal neurologically.  Skin: Warm and dry, no rash or jaundice.   Psych: Alert and cooperative, normal mood and affect.  Labs   Lab Results  Component Value Date   CREATININE 0.75 12/19/2021   BUN 8 12/19/2021   NA 137 12/19/2021   K 3.6 12/19/2021   CL 103 12/19/2021   CO2 24 12/19/2021   Lab Results  Component Value Date   ALT 22 05/03/2012   AST 18 05/03/2012   ALKPHOS 94 05/03/2012   BILITOT 0.3 05/03/2012   Lab Results  Component Value Date   WBC 14.8 (H) 02/21/2022   HGB 15.6 (H) 02/21/2022   HCT 47.1 (H) 02/21/2022   MCV 95.2 02/21/2022   PLT 404 (H) 02/21/2022   No results found for: "IRON", "TIBC", "FERRITIN" Lab Results  Component Value Date   HGBA1C 7.5 (H) 12/19/2021    Imaging Studies   No results found.  Assessment       PLAN   ***   Laureen Ochs. Bobby Rumpf, Tiger, [facility]

## 2022-05-27 ENCOUNTER — Encounter: Payer: Self-pay | Admitting: Gastroenterology

## 2022-05-27 ENCOUNTER — Ambulatory Visit (INDEPENDENT_AMBULATORY_CARE_PROVIDER_SITE_OTHER): Payer: Managed Care, Other (non HMO) | Admitting: Gastroenterology

## 2022-05-27 VITALS — BP 110/76 | HR 98 | Temp 98.4°F | Ht 64.0 in | Wt 238.8 lb

## 2022-05-27 DIAGNOSIS — R1310 Dysphagia, unspecified: Secondary | ICD-10-CM | POA: Insufficient documentation

## 2022-05-27 DIAGNOSIS — K219 Gastro-esophageal reflux disease without esophagitis: Secondary | ICD-10-CM

## 2022-05-27 DIAGNOSIS — R1319 Other dysphagia: Secondary | ICD-10-CM

## 2022-05-27 DIAGNOSIS — Z86010 Personal history of colonic polyps: Secondary | ICD-10-CM

## 2022-05-27 NOTE — Patient Instructions (Addendum)
Colonoscopy and upper endoscopy to be scheduled. See separate instructions.   It was a pleasure to see you today. I want to create trusting relationships with patients and provide genuine, compassionate, and quality care. I truly value your feedback, so please be on the lookout for a survey regarding your visit with me today. I appreciate your time in completing this!

## 2022-05-28 ENCOUNTER — Other Ambulatory Visit: Payer: Self-pay | Admitting: *Deleted

## 2022-05-28 ENCOUNTER — Encounter: Payer: Self-pay | Admitting: *Deleted

## 2022-05-28 MED ORDER — CLENPIQ 10-3.5-12 MG-GM -GM/175ML PO SOLN: 1.0000 | ORAL | 0 refills | Status: AC

## 2022-05-29 ENCOUNTER — Encounter: Payer: Self-pay | Admitting: *Deleted

## 2022-06-10 ENCOUNTER — Inpatient Hospital Stay: Payer: Managed Care, Other (non HMO)

## 2022-06-18 NOTE — Patient Instructions (Signed)
[place]  06/18/2022     @PREFPERIOPPHARMACY @   Your procedure is scheduled on  06/27/2022.   Report to [facility] at  0600  A.M.   Call this number if you have problems the morning of surgery:  [phone]  If you experience any cold or flu symptoms such as cough, fever, chills, shortness of breath, etc. between now and your scheduled surgery, please notify us at the above number.   Remember:  Follow the diet and prep instructions given to you by the office.     Take these medicines the morning of surgery with A SIP OF WATER               celexa, flexeril(if needed), cymbalta, gabapentin, hydrocodone or mobic (if needed), metoprolol, omeprazole, prograf, topamax.     Do not wear jewelry, make-up or nail polish.  Do not wear lotions, powders, or perfumes, or deodorant.  Do not shave 48 hours prior to surgery.  Men may shave face and neck.  Do not bring valuables to the hospital.  [facility] is not responsible for any belongings or valuables.  Contacts, dentures or bridgework may not be worn into surgery.  Leave your suitcase in the car.  After surgery it may be brought to your room.  For patients admitted to the hospital, discharge time will be determined by your treatment team.  Patients discharged the day of surgery will not be allowed to drive home and must have someone with them for 24 hours.    Special instructions:   DO NOT smoke tobacco or vape for 24 hours before your procedure.  Please read over the following fact sheets that you were given. Anesthesia Post-op Instructions and Care and Recovery After Surgery        Upper Endoscopy, Adult, Care After After the procedure, it is common to have a sore throat. It is also common to have: Mild stomach pain or discomfort. Bloating. Nausea. Follow these instructions at home: The instructions below may help you care for yourself at home. Your health care provider may give you more  instructions. If you have questions, ask your health care provider. If you were given a sedative during the procedure, it can affect you for several hours. Do not drive or operate machinery until your health care provider says that it is safe. If you will be going home right after the procedure, plan to have a responsible adult: Take you home from the hospital or clinic. You will not be allowed to drive. Care for you for the time you are told. Follow instructions from your health care provider about what you may eat and drink. Return to your normal activities as told by your health care provider. Ask your health care provider what activities are safe for you. Take over-the-counter and prescription medicines only as told by your health care provider. Contact a health care provider if you: Have a sore throat that lasts longer than one day. Have trouble swallowing. Have a fever. Get help right away if you: Vomit blood or your vomit looks like coffee grounds. Have bloody, black, or tarry stools. Have a very bad sore throat or you cannot swallow. Have difficulty breathing or very bad pain in your chest or abdomen. These symptoms may be an emergency. Get help right away. Call 911. Do not wait to see if the symptoms will go away. Do not drive yourself to the hospital. Summary After  the procedure, it is common to have a sore throat, mild stomach discomfort, bloating, and nausea. If you were given a sedative during the procedure, it can affect you for several hours. Do not drive until your health care provider says that it is safe. Follow instructions from your health care provider about what you may eat and drink. Return to your normal activities as told by your health care provider. This information is not intended to replace advice given to you by your health care provider. Make sure you discuss any questions you have with your health care provider. Document Revised: 06/19/2021 Document Reviewed:  06/19/2021 Elsevier Patient Education  [place]. Esophageal Dilatation Esophageal dilatation, also called esophageal dilation, is a procedure to widen or open a blocked or narrowed part of the esophagus. The esophagus is the part of the body that moves food and liquid from the mouth to the stomach. You may need this procedure if: You have a buildup of scar tissue in your esophagus that makes it difficult, painful, or impossible to swallow. This can be caused by gastroesophageal reflux disease (GERD). You have cancer of the esophagus. There is a problem with how food moves through your esophagus. In some cases, you may need this procedure repeated at a later time to dilate the esophagus gradually. Tell a health care provider about: Any allergies you have. All medicines you are taking, including vitamins, herbs, eye drops, creams, and over-the-counter medicines. Any problems you or family members have had with anesthetic medicines. Any blood disorders you have. Any surgeries you have had. Any medical conditions you have. Any antibiotic medicines you are required to take before dental procedures. Whether you are pregnant or may be pregnant. What are the risks? Generally, this is a safe procedure. However, problems may occur, including: Bleeding due to a tear in the lining of the esophagus. A hole, or perforation, in the esophagus. What happens before the procedure? Ask your health care provider about: Changing or stopping your regular medicines. This is especially important if you are taking diabetes medicines or blood thinners. Taking medicines such as aspirin and ibuprofen. These medicines can thin your blood. Do not take these medicines unless your health care provider tells you to take them. Taking over-the-counter medicines, vitamins, herbs, and supplements. Follow instructions from your health care provider about eating or drinking restrictions. Plan to have a responsible  adult take you home from the hospital or clinic. Plan to have a responsible adult care for you for the time you are told after you leave the hospital or clinic. This is important. What happens during the procedure? You may be given a medicine to help you relax (sedative). A numbing medicine may be sprayed into the back of your throat, or you may gargle the medicine. Your health care provider may perform the dilatation using various surgical instruments, such as: Simple dilators. This instrument is carefully placed in the esophagus to stretch it. Guided wire bougies. This involves using an endoscope to insert a wire into the esophagus. A dilator is passed over this wire to enlarge the esophagus. Then the wire is removed. Balloon dilators. An endoscope with a small balloon is inserted into the esophagus. The balloon is inflated to stretch the esophagus and open it up. The procedure may vary among health care providers and hospitals. What can I expect after the procedure? Your blood pressure, heart rate, breathing rate, and blood oxygen level will be monitored until you leave the hospital or clinic. Your  throat may feel slightly sore and numb. This will get better over time. You will not be allowed to eat or drink until your throat is no longer numb. When you are able to drink, urinate, and sit on the edge of the bed without nausea or dizziness, you may be able to return home. Follow these instructions at home: Take over-the-counter and prescription medicines only as told by your health care provider. If you were given a sedative during the procedure, it can affect you for several hours. Do not drive or operate machinery until your health care provider says that it is safe. Plan to have a responsible adult care for you for the time you are told. This is important. Follow instructions from your health care provider about any eating or drinking restrictions. Do not use any products that contain  nicotine or tobacco, such as cigarettes, e-cigarettes, and chewing tobacco. If you need help quitting, ask your health care provider. Keep all follow-up visits. This is important. Contact a health care provider if: You have a fever. You have pain that is not relieved by medicine. Get help right away if: You have chest pain. You have trouble breathing. You have trouble swallowing. You vomit blood. You have black, tarry, or bloody stools. These symptoms may represent a serious problem that is an emergency. Do not wait to see if the symptoms will go away. Get medical help right away. Call your local emergency services (911 in the [place] Do not drive yourself to the hospital. Summary Esophageal dilatation, also called esophageal dilation, is a procedure to widen or open a blocked or narrowed part of the esophagus. Plan to have a responsible adult take you home from the hospital or clinic. For this procedure, a numbing medicine may be sprayed into the back of your throat, or you may gargle the medicine. Do not drive or operate machinery until your health care provider says that it is safe. This information is not intended to replace advice given to you by your health care provider. Make sure you discuss any questions you have with your health care provider. Document Revised: 07/27/2019 Document Reviewed: 07/27/2019 Elsevier Patient Education  [place]. Colonoscopy, Adult, Care After The following information offers guidance on how to care for yourself after your procedure. Your health care provider may also give you more specific instructions. If you have problems or questions, contact your health care provider. What can I expect after the procedure? After the procedure, it is common to have: A small amount of blood in your stool for 24 hours after the procedure. Some gas. Mild cramping or bloating of your abdomen. Follow these instructions at home: Eating and drinking  Drink  enough fluid to keep your urine pale yellow. Follow instructions from your health care provider about eating or drinking restrictions. Resume your normal diet as told by your health care provider. Avoid heavy or fried foods that are hard to digest. Activity Rest as told by your health care provider. Avoid sitting for a long time without moving. Get up to take short walks every 1-2 hours. This is important to improve blood flow and breathing. Ask for help if you feel weak or unsteady. Return to your normal activities as told by your health care provider. Ask your health care provider what activities are safe for you. Managing cramping and bloating  Try walking around when you have cramps or feel bloated. If directed, apply heat to your abdomen as told by your health care  provider. Use the heat source that your health care provider recommends, such as a moist heat pack or a heating pad. Place a towel between your skin and the heat source. Leave the heat on for 20-30 minutes. Remove the heat if your skin turns bright red. This is especially important if you are unable to feel pain, heat, or cold. You have a greater risk of getting burned. General instructions If you were given a sedative during the procedure, it can affect you for several hours. Do not drive or operate machinery until your health care provider says that it is safe. For the first 24 hours after the procedure: Do not sign important documents. Do not drink alcohol. Do your regular daily activities at a slower pace than normal. Eat soft foods that are easy to digest. Take over-the-counter and prescription medicines only as told by your health care provider. Keep all follow-up visits. This is important. Contact a health care provider if: You have blood in your stool 2-3 days after the procedure. Get help right away if: You have more than a small spotting of blood in your stool. You have large blood clots in your stool. You have  swelling of your abdomen. You have nausea or vomiting. You have a fever. You have increasing pain in your abdomen that is not relieved with medicine. These symptoms may be an emergency. Get help right away. Call 911. Do not wait to see if the symptoms will go away. Do not drive yourself to the hospital. Summary After the procedure, it is common to have a small amount of blood in your stool. You may also have mild cramping and bloating of your abdomen. If you were given a sedative during the procedure, it can affect you for several hours. Do not drive or operate machinery until your health care provider says that it is safe. Get help right away if you have a lot of blood in your stool, nausea or vomiting, a fever, or increased pain in your abdomen. This information is not intended to replace advice given to you by your health care provider. Make sure you discuss any questions you have with your health care provider. Document Revised: 10/31/2020 Document Reviewed: 10/31/2020 Elsevier Patient Education  [place] After The following information offers guidance on how to care for yourself after your procedure. Your health care provider may also give you more specific instructions. If you have problems or questions, contact your health care provider. What can I expect after the procedure? After the procedure, it is common to have: Tiredness. Little or no memory about what happened during or after the procedure. Impaired judgment when it comes to making decisions. Nausea or vomiting. Some trouble with balance. Follow these instructions at home: For the time period you were told by your health care provider:  Rest. Do not participate in activities where you could fall or become injured. Do not drive or use machinery. Do not drink alcohol. Do not take sleeping pills or medicines that cause drowsiness. Do not make important decisions or sign legal  documents. Do not take care of children on your own. Medicines Take over-the-counter and prescription medicines only as told by your health care provider. If you were prescribed antibiotics, take them as told by your health care provider. Do not stop using the antibiotic even if you start to feel better. Eating and drinking Follow instructions from your health care provider about what you may eat and drink. Drink  enough fluid to keep your urine pale yellow. If you vomit: Drink clear fluids slowly and in small amounts as you are able. Clear fluids include water, ice chips, low-calorie sports drinks, and fruit juice that has water added to it (diluted fruit juice). Eat light and bland foods in small amounts as you are able. These foods include bananas, applesauce, rice, lean meats, toast, and crackers. General instructions  Have a responsible adult stay with you for the time you are told. It is important to have someone help care for you until you are awake and alert. If you have sleep apnea, surgery and some medicines can increase your risk for breathing problems. Follow instructions from your health care provider about wearing your sleep device: When you are sleeping. This includes during daytime naps. While taking prescription pain medicines, sleeping medicines, or medicines that make you drowsy. Do not use any products that contain nicotine or tobacco. These products include cigarettes, chewing tobacco, and vaping devices, such as e-cigarettes. If you need help quitting, ask your health care provider. Contact a health care provider if: You feel nauseous or vomit every time you eat or drink. You feel light-headed. You are still sleepy or having trouble with balance after 24 hours. You get a rash. You have a fever. You have redness or swelling around the IV site. Get help right away if: You have trouble breathing. You have new confusion after you get home. These symptoms may be an  emergency. Get help right away. Call 911. Do not wait to see if the symptoms will go away. Do not drive yourself to the hospital. This information is not intended to replace advice given to you by your health care provider. Make sure you discuss any questions you have with your health care provider. Document Revised: 08/05/2021 Document Reviewed: 08/05/2021 Elsevier Patient Education  [place].

## 2022-06-23 ENCOUNTER — Telehealth: Payer: Self-pay | Admitting: *Deleted

## 2022-06-23 ENCOUNTER — Encounter (HOSPITAL_COMMUNITY)
Admission: RE | Admit: 2022-06-23 | Discharge: 2022-06-23 | Disposition: A | Discharge status: Home / Self Care | Payer: Managed Care, Other (non HMO) | Source: Ambulatory Visit | Attending: Internal Medicine | Admitting: Internal Medicine

## 2022-06-23 ENCOUNTER — Encounter (HOSPITAL_COMMUNITY): Payer: Self-pay

## 2022-06-23 VITALS — BP 105/77 | HR 95 | Temp 97.6°F | Resp 18 | Ht 64.0 in | Wt 238.8 lb

## 2022-06-23 DIAGNOSIS — Z01812 Encounter for preprocedural laboratory examination: Secondary | ICD-10-CM | POA: Insufficient documentation

## 2022-06-23 DIAGNOSIS — E119 Type 2 diabetes mellitus without complications: Secondary | ICD-10-CM | POA: Diagnosis not present

## 2022-06-23 LAB — BASIC METABOLIC PANEL
Anion gap: 8 (ref 5–15)
BUN: 9 mg/dL (ref 6–20)
CO2: 26 mmol/L (ref 22–32)
Calcium: 8.7 mg/dL — ABNORMAL LOW (ref 8.9–10.3)
Chloride: 101 mmol/L (ref 98–111)
Creatinine, Ser: 0.89 mg/dL (ref 0.44–1.00)
GFR, Estimated: >60 mL/min (ref >60)
Glucose, Bld: 252 mg/dL — ABNORMAL HIGH (ref 70–99)
Potassium: 4 mmol/L (ref 3.5–5.1)
Sodium: 135 mmol/L (ref 135–145)

## 2022-06-23 NOTE — Telephone Encounter (Signed)
[place] PA: [id number]- No PA required- confirmation # [id number] PA approval # [id number], DOS: 06/23/22-12/20/22

## 2022-06-25 ENCOUNTER — Other Ambulatory Visit (HOSPITAL_COMMUNITY): Payer: Managed Care, Other (non HMO)

## 2022-06-27 ENCOUNTER — Encounter (HOSPITAL_COMMUNITY): Payer: Self-pay | Admitting: Internal Medicine

## 2022-06-27 ENCOUNTER — Ambulatory Visit (HOSPITAL_COMMUNITY): Payer: Managed Care, Other (non HMO) | Admitting: Certified Registered Nurse Anesthetist

## 2022-06-27 ENCOUNTER — Ambulatory Visit (HOSPITAL_COMMUNITY)
Admission: RE | Admit: 2022-06-27 | Discharge: 2022-06-27 | Disposition: A | Discharge status: Home / Self Care | Payer: Managed Care, Other (non HMO) | Attending: Internal Medicine | Admitting: Internal Medicine

## 2022-06-27 ENCOUNTER — Encounter (HOSPITAL_COMMUNITY)
Admission: RE | Disposition: A | Discharge status: Home / Self Care | Payer: Self-pay | Source: Home / Self Care | Attending: Internal Medicine

## 2022-06-27 DIAGNOSIS — Q438 Other specified congenital malformations of intestine: Secondary | ICD-10-CM | POA: Insufficient documentation

## 2022-06-27 DIAGNOSIS — Z6841 Body Mass Index (BMI) 40.0 and over, adult: Secondary | ICD-10-CM | POA: Diagnosis not present

## 2022-06-27 DIAGNOSIS — Z1211 Encounter for screening for malignant neoplasm of colon: Secondary | ICD-10-CM

## 2022-06-27 DIAGNOSIS — Z7984 Long term (current) use of oral hypoglycemic drugs: Secondary | ICD-10-CM | POA: Diagnosis not present

## 2022-06-27 DIAGNOSIS — K641 Second degree hemorrhoids: Secondary | ICD-10-CM

## 2022-06-27 DIAGNOSIS — J449 Chronic obstructive pulmonary disease, unspecified: Secondary | ICD-10-CM | POA: Diagnosis not present

## 2022-06-27 DIAGNOSIS — Z86010 Personal history of colonic polyps: Secondary | ICD-10-CM | POA: Diagnosis not present

## 2022-06-27 DIAGNOSIS — D124 Benign neoplasm of descending colon: Secondary | ICD-10-CM

## 2022-06-27 DIAGNOSIS — F17210 Nicotine dependence, cigarettes, uncomplicated: Secondary | ICD-10-CM

## 2022-06-27 DIAGNOSIS — I10 Essential (primary) hypertension: Secondary | ICD-10-CM | POA: Insufficient documentation

## 2022-06-27 DIAGNOSIS — E119 Type 2 diabetes mellitus without complications: Secondary | ICD-10-CM | POA: Diagnosis not present

## 2022-06-27 DIAGNOSIS — K219 Gastro-esophageal reflux disease without esophagitis: Secondary | ICD-10-CM | POA: Insufficient documentation

## 2022-06-27 DIAGNOSIS — G4730 Sleep apnea, unspecified: Secondary | ICD-10-CM | POA: Insufficient documentation

## 2022-06-27 DIAGNOSIS — F17200 Nicotine dependence, unspecified, uncomplicated: Secondary | ICD-10-CM | POA: Diagnosis not present

## 2022-06-27 DIAGNOSIS — R1310 Dysphagia, unspecified: Secondary | ICD-10-CM

## 2022-06-27 HISTORY — PX: MALONEY DILATION: SHX5535

## 2022-06-27 HISTORY — PX: COLONOSCOPY WITH PROPOFOL: SHX5780

## 2022-06-27 HISTORY — PX: POLYPECTOMY: SHX149

## 2022-06-27 HISTORY — PX: ESOPHAGOGASTRODUODENOSCOPY (EGD) WITH PROPOFOL: SHX5813

## 2022-06-27 LAB — GLUCOSE, CAPILLARY: Glucose-Capillary: 186 mg/dL — ABNORMAL HIGH (ref 70–99)

## 2022-06-27 SURGERY — COLONOSCOPY WITH PROPOFOL
Anesthesia: General

## 2022-06-27 MED ORDER — PROPOFOL 500 MG/50ML IV EMUL
INTRAVENOUS | Status: DC | PRN
  Administered 2022-06-27: 175 ug/kg/min via INTRAVENOUS

## 2022-06-27 MED ORDER — LACTATED RINGERS IV SOLN: INTRAVENOUS | Status: DC

## 2022-06-27 MED ORDER — LIDOCAINE 2% (20 MG/ML) 5 ML SYRINGE
INTRAMUSCULAR | Status: DC | PRN
  Administered 2022-06-27: 50 mg via INTRAVENOUS

## 2022-06-27 MED ORDER — PROPOFOL 10 MG/ML IV BOLUS
INTRAVENOUS | Status: DC | PRN
  Administered 2022-06-27: 50 mg via INTRAVENOUS

## 2022-06-27 MED ORDER — STERILE WATER FOR IRRIGATION IR SOLN
Status: DC | PRN
  Administered 2022-06-27: 60 mL

## 2022-06-27 NOTE — H&P (Addendum)
@LOGO @   Primary Care Physician:  Richardean Chimera, MD Primary Gastroenterologist:  Dr. Jena Gauss  Pre-Procedure History & Physical: HPI:  Stephanie Huang is a 57 y.o. female here for further evaluation of GERD and esophageal dysphagia.  History colonic adenoma; here for surveillance colonoscopy as well.  Past Medical History:  Diagnosis Date   Anxiety    COPD (chronic obstructive pulmonary disease)    Depression    Diabetes    Fibromyalgia    Fibromyalgia syndrome    GERD (gastroesophageal reflux disease)    Headache(784.0)    Mirgraine- January 2014- last one   History of blood transfusion    after C- Section   Hyperlipemia    Hypertension    IBS (irritable bowel syndrome)    Left knee DJD    Lichen planus    Migraines    RA (rheumatoid arthritis)    Recurrent UTI    Sleep apnea    Diagnosed 5 years ago    Past Surgical History:  Procedure Laterality Date   CATARACT EXTRACTION Right    CESAREAN SECTION     KNEE ARTHROSCOPY Bilateral 09/22/2011   PARTIAL KNEE ARTHROPLASTY Left 05/10/2012   Procedure: UNICOMPARTMENTAL KNEE;  Surgeon: Nilda Simmer, MD;  Location: [facility] OR;  Service: Orthopedics;  Laterality: Left;   TUBAL LIGATION     VAGINAL HYSTERECTOMY      Prior to Admission medications   Medication Sig Start Date End Date Taking? Authorizing Provider  Abatacept 125 MG/ML SOSY Inject 125 mg into the skin every 7 (seven) days.   Yes [provider]  acidophilus (RISAQUAD) CAPS capsule Take 1 capsule by mouth daily.   Yes [provider]  albuterol (VENTOLIN HFA) 108 (90 Base) MCG/ACT inhaler Inhale 1-2 puffs into the lungs every 6 (six) hours as needed for wheezing or shortness of breath.   Yes [provider]  ANORO ELLIPTA 62.5-25 MCG/INH AEPB Inhale 1 puff into the lungs daily. 02/03/20  Yes [provider]  aspirin 81 MG chewable tablet Chew 81 mg by mouth daily.   Yes [provider]  atorvastatin (LIPITOR) 40 MG  tablet Take 40 mg by mouth daily.   Yes [provider]  bisacodyl (DULCOLAX) 5 MG EC tablet Take 2 tablets every night with dinner until bowel movement.  LAXITIVE.  Restart if two days since last bowel movement 05/11/12  Yes Shepperson, Kirstin, PA-C  buPROPion (WELLBUTRIN XL) 150 MG 24 hr tablet Take 150 mg by mouth in the morning and at bedtime. 11/18/19  Yes [provider]  citalopram (CELEXA) 20 MG tablet Take 20 mg by mouth daily.    Yes [provider]  clobetasol ointment (TEMOVATE) 0.05 % Apply 1 Application topically daily as needed (rash). 10/07/19  Yes [provider]  cyclobenzaprine (FLEXERIL) 10 MG tablet Take 10 mg by mouth 3 (three) times daily as needed for muscle spasms.   Yes [provider]  diclofenac (VOLTAREN) 75 MG EC tablet Take 75 mg by mouth 2 (two) times daily. 05/29/21  Yes [provider]  docusate sodium 100 MG CAPS 1 tab 2 times a day while on narcotics.  STOOL SOFTENER 05/11/12  Yes Shepperson, Kirstin, PA-C  DULoxetine (CYMBALTA) 60 MG capsule Take 60 mg by mouth 2 (two) times daily. 11/05/19  Yes [provider]  folic acid (FOLVITE) 1 MG tablet Take 1 mg by mouth daily. 03/08/19  Yes [provider]  gabapentin (NEURONTIN) 600 MG tablet Take  600 mg by mouth 3 (three) times daily. 04/30/21  Yes [provider]  glucosamine-chondroitin 500-400 MG tablet Take 1 tablet by mouth in the morning and at bedtime. 10/28/17  Yes [provider]  HYDROcodone-acetaminophen (NORCO/VICODIN) 5-325 MG tablet Take 1 tablet by mouth 4 (four) times daily. 01/02/20  Yes [provider]  hydrocortisone 2.5 % cream Apply 1 Application topically daily as needed (irritation). 10/05/19  Yes [provider]  JARDIANCE 25 MG TABS tablet Take 25 mg by mouth daily. 04/30/21  Yes [provider]  lisinopril-hydrochlorothiazide (PRINZIDE,ZESTORETIC) 20-12.5 MG per tablet Take 1 tablet by mouth  daily.   Yes [provider]  meloxicam (MOBIC) 15 MG tablet Take 7.5 mg by mouth in the morning and at bedtime. 09/20/16  Yes [provider]  metFORMIN (GLUCOPHAGE-XR) 500 MG 24 hr tablet Take 500 mg by mouth 2 (two) times daily with a meal. 04/30/21  Yes [provider]  metoprolol succinate (TOPROL-XL) 25 MG 24 hr tablet Take 25 mg by mouth daily. 09/06/19  Yes [provider]  MOUNJARO 7.5 MG/0.5ML Pen Inject 7.5 mg into the skin once a week. 05/29/21  Yes [provider]  nitrofurantoin (MACRODANTIN) 50 MG capsule TAKE ONE CAPSULE BY MOUTH AT BEDTIME 04/25/22  Yes McKenzie, Mardene CelestePatrick L, MD  NON FORMULARY Pt uses a cpap nightly   Yes [provider]  norethindrone (AYGESTIN) 5 MG tablet Take 5 mg by mouth daily. 11/05/19  Yes [provider]  omeprazole (PRILOSEC) 20 MG capsule Take 20 mg by mouth daily.   Yes [provider]  Sod Picosulfate-Mag Ox-Cit Acd (CLENPIQ) 10-3.5-12 MG-GM -GM/175ML SOLN Take 1 kit by mouth as directed. 05/28/22  Yes [place] Blizzard, Gerrit Friendsobert M, MD  solifenacin (VESICARE) 10 MG tablet Take 1 tablet (10 mg total) by mouth daily. 06/10/21  Yes McKenzie, Mardene CelestePatrick L, MD  tacrolimus (PROGRAF) 1 MG capsule Take 1 mg by mouth 2 (two) times daily as needed (lichen planus). Dissolve 1 mg into a glass of water, swish and spit up to twice daily as needed for lichen planus sores. 10/05/19  Yes [provider]  topiramate (TOPAMAX) 100 MG tablet Take 100 mg by mouth 2 (two) times daily.   Yes [provider]  trimethoprim (TRIMPEX) 100 MG tablet TAKE 1 TABLET BY MOUTH AT BEDTIME 04/15/22  Yes McKenzie, Mardene CelestePatrick L, MD  zolpidem (AMBIEN) 10 MG tablet Take 10 mg by mouth at bedtime.   Yes [provider]  sulfamethoxazole-trimethoprim (BACTRIM DS) 800-160 MG tablet Take 1 tablet by mouth 2 (two) times daily. Patient not taking: Reported on 06/18/2022 05/03/21   [place], Regan RakersJulienne Annette, PA-C    Allergies as of  05/28/2022 - Review Complete 05/27/2022  Allergen Reaction Noted   Methotrexate derivatives Other (See Comments) 04/01/2017   Lyrica [pregabalin] Nausea And Vomiting and Swelling 04/27/2012    Family History  Problem Relation Age of Onset   Pneumonia Mother    Cancer Father        prostate   Colon cancer Neg Hx     Social History   Socioeconomic History   Marital status: Married    Spouse name: Not on file   Number of children: Not on file   Years of education: Not on file   Highest education level: Not on file  Occupational History   Occupation: disabled  Tobacco Use   Smoking status: Every Day    Packs/day: 1.00    Years: 30.00    Additional  pack years: 0.00    Total pack years: 30.00    Types: Cigarettes    Last attempt to quit: 03/05/2012    Years since quitting: 10.3   Smokeless tobacco: Never  Substance and Sexual Activity   Alcohol use: No   Drug use: No   Sexual activity: Yes    Birth control/protection: Surgical  Other Topics Concern   Not on file  Social History Narrative   Not on file   Social Determinants of Health   Financial Resource Strain: Not on file  Food Insecurity: Not on file  Transportation Needs: Not on file  Physical Activity: Not on file  Stress: Not on file  Social Connections: Not on file  Intimate Partner Violence: Not on file    Review of Systems: See HPI, otherwise negative ROS  Physical Exam: BP 112/69   Pulse 100   Temp 98.2 F (36.8 C) (Oral)   Resp 11   SpO2 95%  General:   Alert,  Well-developed, well-nourished, pleasant and cooperative in NAD Neck:  Supple; no masses or thyromegaly. No significant cervical adenopathy. Lungs:  Clear throughout to auscultation.   No wheezes, crackles, or rhonchi. No acute distress. Heart:  Regular rate and rhythm; no murmurs, clicks, rubs,  or gallops. Abdomen: Non-distended, normal bowel sounds.  Soft and nontender without appreciable mass or hepatosplenomegaly.  Impression/Plan:  57 year old lady with longstanding GERD now with esophageal dysphagia here for EGD for further evaluation.  Also, history of colonic adenoma.  Here for surveillance colonoscopy as well. The risks, benefits, limitations, imponderables and alternatives regarding both EGD and colonoscopy have been reviewed with the patient. Questions have been answered. All parties agreeable.       Notice: This dictation was prepared with Dragon dictation along with smaller phrase technology. Any transcriptional errors that result from this process are unintentional and may not be corrected upon review.

## 2022-06-27 NOTE — Anesthesia Preprocedure Evaluation (Signed)
Anesthesia Evaluation  Patient identified by MRN, date of birth, ID band Patient awake    Reviewed: Allergy & Precautions, H&P , NPO status , Patient's Chart, lab work & pertinent test results, reviewed documented beta blocker date and time   Airway Mallampati: II  TM Distance: >3 FB Neck ROM: full    Dental no notable dental hx.    Pulmonary neg pulmonary ROS, sleep apnea , COPD, Current Smoker and Patient abstained from smoking.   Pulmonary exam normal breath sounds clear to auscultation       Cardiovascular Exercise Tolerance: Good hypertension, negative cardio ROS  Rhythm:regular Rate:Normal     Neuro/Psych  Headaches PSYCHIATRIC DISORDERS Anxiety Depression     Neuromuscular disease negative neurological ROS  negative psych ROS   GI/Hepatic negative GI ROS, Neg liver ROS,GERD  ,,  Endo/Other  diabetes  Morbid obesity  Renal/GU negative Renal ROS  negative genitourinary   Musculoskeletal   Abdominal   Peds  Hematology negative hematology ROS (+)   Anesthesia Other Findings   Reproductive/Obstetrics negative OB ROS                             Anesthesia Physical Anesthesia Plan  ASA: 3  Anesthesia Plan: General   Post-op Pain Management:    Induction:   PONV Risk Score and Plan: Propofol infusion  Airway Management Planned:   Additional Equipment:   Intra-op Plan:   Post-operative Plan:   Informed Consent: I have reviewed the patients History and Physical, chart, labs and discussed the procedure including the risks, benefits and alternatives for the proposed anesthesia with the patient or authorized representative who has indicated his/her understanding and acceptance.     Dental Advisory Given  Plan Discussed with: CRNA  Anesthesia Plan Comments:        Anesthesia Quick Evaluation

## 2022-06-27 NOTE — Anesthesia Postprocedure Evaluation (Signed)
Anesthesia Post Note  Patient: Stephanie Huang  Procedure(s) Performed: COLONOSCOPY WITH PROPOFOL ESOPHAGOGASTRODUODENOSCOPY (EGD) WITH PROPOFOL MALONEY DILATION POLYPECTOMY INTESTINAL  Patient location during evaluation: Phase II Anesthesia Type: General Level of consciousness: awake Pain management: pain level controlled Vital Signs Assessment: post-procedure vital signs reviewed and stable Respiratory status: spontaneous breathing and respiratory function stable Cardiovascular status: blood pressure returned to baseline and stable Postop Assessment: no headache and no apparent nausea or vomiting Anesthetic complications: no Comments: Late entry   No notable events documented.   Last Vitals:  Vitals:   06/27/22 0658 06/27/22 0841  BP: 112/69 123/82  Pulse: 100 87  Resp: 11 14  Temp: 36.8 C 36.6 C  SpO2: 95% 96%    Last Pain:  Vitals:   06/27/22 0841  TempSrc: Oral  PainSc: 0-No pain                 Windell Norfolk

## 2022-06-27 NOTE — Op Note (Signed)
[facility] Patient Name: Stephanie Huang Procedure Date: 06/27/2022 7:41 AM MRN: [id number] Date of Birth: 1966/02/12 Attending MD: Gennette Pacobert Michael Ailea Rhatigan , MD, [phone] CSN: [id number] Age: 89 Admit Type: Outpatient Procedure:                Colonoscopy Indications:              High risk colon cancer surveillance: Personal                            history of colonic polyps Providers:                Gennette Pacobert Michael Amorah Sebring, MD, Crystal Page, Lennice SitesBrandi Neal                            Technician, Technician Referring MD:              Medicines:                Propofol per Anesthesia Complications:            No immediate complications. Estimated Blood Loss:     Estimated blood loss was minimal. Procedure:                Pre-Anesthesia Assessment:                           - Prior to the procedure, a History and Physical                            was performed, and patient medications and                            allergies were reviewed. The patient's tolerance of                            previous anesthesia was also reviewed. The risks                            and benefits of the procedure and the sedation                            options and risks were discussed with the patient.                            All questions were answered, and informed consent                            was obtained. Prior Anticoagulants: The patient has                            taken no anticoagulant or antiplatelet agents. ASA                            Grade Assessment: III - A patient with severe  systemic disease. After reviewing the risks and                            benefits, the patient was deemed in satisfactory                            condition to undergo the procedure.                           After obtaining informed consent, the colonoscope                            was passed under direct vision. Throughout the                            procedure, the  patient's blood pressure, pulse, and                            oxygen saturations were monitored continuously. The                            [id number]) scope was introduced through the                            anus and advanced to the the cecum, identified by                            appendiceal orifice and ileocecal valve. The                            colonoscopy was performed without difficulty. The                            patient tolerated the procedure well. The quality                            of the bowel preparation was adequate. The                            ileocecal valve, appendiceal orifice, and rectum                            were photographed. Scope In: 8:11:28 AM Scope Out: 8:30:54 AM Scope Withdrawal Time: 0 hours 9 minutes 30 seconds  Total Procedure Duration: 0 hours 19 minutes 26 seconds  Findings:      The perianal and digital rectal examinations were normal. Markedly       elongated and redundant colon. Changes to the patient's position and       external abdominal pressure required to reach the cecum.      Non-bleeding internal hemorrhoids were found during retroflexion. The       hemorrhoids were moderate, medium-sized and Grade II (internal       hemorrhoids that prolapse but reduce spontaneously).      A 6 mm polyp was found in the mid descending colon.  The polyp was       sessile. The polyp was removed with a cold snare. Resection and       retrieval were complete. Estimated blood loss was minimal.      The exam was otherwise without abnormality on direct and retroflexion       views. Impression:               - Non-bleeding internal hemorrhoids. Redundant and                            elongated colon.                           - One 6 mm polyp in the mid descending colon,                            removed with a cold snare. Resected and retrieved.                           - The examination was otherwise normal on direct                             and retroflexion views. Moderate Sedation:      Moderate (conscious) sedation was personally administered by an       anesthesia professional. The following parameters were monitored: oxygen       saturation, heart rate, blood pressure, respiratory rate, EKG, adequacy       of pulmonary ventilation, and response to care. Recommendation:           - Patient has a contact number available for                            emergencies. The signs and symptoms of potential                            delayed complications were discussed with the                            patient. Return to normal activities tomorrow.                            Written discharge instructions were provided to the                            patient.                           - Advance diet as tolerated.                           - Continue present medications.                           - Repeat colonoscopy date to be determined after  pending pathology results are reviewed for                            surveillance.                           - Return to GI office (date not yet determined).                            See EGD report Procedure Code(s):        --- Professional ---                           [id number], Colonoscopy, flexible; with removal of                            tumor(s), polyp(s), or other lesion(s) by snare                            technique Diagnosis Code(s):        --- Professional ---                           Z86.010, Personal history of colonic polyps                           D12.4, Benign neoplasm of descending colon                           K64.1, Second degree hemorrhoids CPT copyright 2022 [employer]. All rights reserved. The codes documented in this report are preliminary and upon coder review may  be revised to meet current compliance requirements. Gerrit Friends. Lorissa Kishbaugh, MD Gennette Pac, MD 06/27/2022 8:39:33 AM This report  has been signed electronically. Number of Addenda: 0

## 2022-06-27 NOTE — Discharge Instructions (Signed)
EGD Discharge instructions Please read the instructions outlined below and refer to this sheet in the next few weeks. These discharge instructions provide you with general information on caring for yourself after you leave the hospital. Your doctor may also give you specific instructions. While your treatment has been planned according to the most current medical practices available, unavoidable complications occasionally occur. If you have any problems or questions after discharge, please call your doctor. ACTIVITY You may resume your regular activity but move at a slower pace for the next 24 hours.  Take frequent rest periods for the next 24 hours.  Walking will help expel (get rid of) the air and reduce the bloated feeling in your abdomen.  No driving for 24 hours (because of the anesthesia (medicine) used during the test).  You may shower.  Do not sign any important legal documents or operate any machinery for 24 hours (because of the anesthesia used during the test).  NUTRITION Drink plenty of fluids.  You may resume your normal diet.  Begin with a light meal and progress to your normal diet.  Avoid alcoholic beverages for 24 hours or as instructed by your caregiver.  MEDICATIONS You may resume your normal medications unless your caregiver tells you otherwise.  WHAT YOU CAN EXPECT TODAY You may experience abdominal discomfort such as a feeling of fullness or "gas" pains.  FOLLOW-UP Your doctor will discuss the results of your test with you.  SEEK IMMEDIATE MEDICAL ATTENTION IF ANY OF THE FOLLOWING OCCUR: Excessive nausea (feeling sick to your stomach) and/or vomiting.  Severe abdominal pain and distention (swelling).  Trouble swallowing.  Temperature over 101 F (37.8 C).  Rectal bleeding or vomiting of blood.    Colonoscopy Discharge Instructions  Read the instructions outlined below and refer to this sheet in the next few weeks. These discharge instructions provide you with  general information on caring for yourself after you leave the hospital. Your doctor may also give you specific instructions. While your treatment has been planned according to the most current medical practices available, unavoidable complications occasionally occur. If you have any problems or questions after discharge, call Dr. Gala Romney at [phone]. ACTIVITY You may resume your regular activity, but move at a slower pace for the next 24 hours.  Take frequent rest periods for the next 24 hours.  Walking will help get rid of the air and reduce the bloated feeling in your belly (abdomen).  No driving for 24 hours (because of the medicine (anesthesia) used during the test).   Do not sign any important legal documents or operate any machinery for 24 hours (because of the anesthesia used during the test).  NUTRITION Drink plenty of fluids.  You may resume your normal diet as instructed by your doctor.  Begin with a light meal and progress to your normal diet. Heavy or fried foods are harder to digest and may make you feel sick to your stomach (nauseated).  Avoid alcoholic beverages for 24 hours or as instructed.  MEDICATIONS You may resume your normal medications unless your doctor tells you otherwise.  WHAT YOU CAN EXPECT TODAY Some feelings of bloating in the abdomen.  Passage of more gas than usual.  Spotting of blood in your stool or on the toilet paper.  IF YOU HAD POLYPS REMOVED DURING THE COLONOSCOPY: No aspirin products for 7 days or as instructed.  No alcohol for 7 days or as instructed.  Eat a soft diet for the next 24 hours.  FINDING  OUT THE RESULTS OF YOUR TEST Not all test results are available during your visit. If your test results are not back during the visit, make an appointment with your caregiver to find out the results. Do not assume everything is normal if you have not heard from your caregiver or the medical facility. It is important for you to follow up on all of your test  results.  SEEK IMMEDIATE MEDICAL ATTENTION IF: You have more than a spotting of blood in your stool.  Your belly is swollen (abdominal distention).  You are nauseated or vomiting.  You have a temperature over 101.  You have abdominal pain or discomfort that is severe or gets worse throughout the day.     Your upper GI tract appeared normal.  Your esophagus was stretched today  You had 1 polyp in your colon-it was removed.  Further recommendations to follow pending review of pathology report  At patient request I called Elayna Fausto at [phone] -reviewed findings and recommendations

## 2022-06-27 NOTE — Transfer of Care (Signed)
Immediate Anesthesia Transfer of Care Note  Patient: Stephanie Huang  Procedure(s) Performed: COLONOSCOPY WITH PROPOFOL ESOPHAGOGASTRODUODENOSCOPY (EGD) WITH PROPOFOL MALONEY DILATION POLYPECTOMY INTESTINAL  Patient Location: PACU  Anesthesia Type:General  Level of Consciousness: awake  Airway & Oxygen Therapy: Patient Spontanous Breathing  Post-op Assessment: Report given to RN and Post -op Vital signs reviewed and stable  Post vital signs: Reviewed and stable  Last Vitals:  Vitals Value Taken Time  BP    Temp    Pulse    Resp    SpO2      Last Pain:  Vitals:   06/27/22 0750  TempSrc:   PainSc: 9          Complications: No notable events documented.

## 2022-06-30 LAB — SURGICAL PATHOLOGY

## 2022-07-01 ENCOUNTER — Encounter: Payer: Self-pay | Admitting: Internal Medicine

## 2022-07-03 NOTE — Op Note (Signed)
[facility] Patient Name: Stephanie BangJo Jacquin Procedure Date: 06/27/2022 7:40 AM MRN: [id number] Date of Birth: [birth year] Attending MD: Gennette Pacobert Michael Gaurav Baldree , MD, [phone] CSN: [id number] Age: 57 Admit Type: Outpatient Procedure:                Upper GI endoscopy Indications:              Dysphagia Providers:                Gennette Pacobert Michael Ashish Rossetti, MD, Crystal Page, Lennice SitesBrandi Neal                            Technician, Technician Referring MD:              Medicines:                Propofol per Anesthesia Complications:            No immediate complications. Estimated Blood Loss:     Estimated blood loss: none. Procedure:                Pre-Anesthesia Assessment:                           - Prior to the procedure, a History and Physical                            was performed, and patient medications and                            allergies were reviewed. The patient's tolerance of                            previous anesthesia was also reviewed. The risks                            and benefits of the procedure and the sedation                            options and risks were discussed with the patient.                            All questions were answered, and informed consent                            was obtained. Prior Anticoagulants: The patient has                            taken no anticoagulant or antiplatelet agents. ASA                            Grade Assessment: III - A patient with severe                            systemic disease. After reviewing the risks and  benefits, the patient was deemed in satisfactory                            condition to undergo the procedure.                           After obtaining informed consent, the endoscope was                            passed under direct vision. Throughout the                            procedure, the patient's blood pressure, pulse, and                            oxygen saturations were  monitored continuously. The                            [id number]-[id number] ([id number]) scope was introduced through the                            mouth, and advanced to the second part of duodenum.                            The upper GI endoscopy was accomplished without                            difficulty. The patient tolerated the procedure                            well. Scope In: 7:56:23 AM Scope Out: 8:02:37 AM Total Procedure Duration: 0 hours 6 minutes 14 seconds  Findings:      The examined esophagus was normal. Gastric cavity with minimal retained       contents. Easily lavaged and suctioned out. The scope was withdrawn.       Dilation was performed with a Maloney dilator with mild resistance at 54       Fr. The scope was withdrawn. Dilation was performed with a Maloney       dilator with mild resistance at 56 Fr. The dilation site was examined       following endoscope reinsertion and showed no change. Estimated blood       loss: none.      The entire examined stomach was normal. Patent pylorus.      The duodenal bulb and second portion of the duodenum were normal. Impression:               - Normal esophagus. Dilated.                           - Normal stomach.                           - Normal duodenal bulb and second portion of the  duodenum.                           - No specimens collected. Moderate Sedation:      Moderate (conscious) sedation was personally administered by an       anesthesia professional. The following parameters were monitored: oxygen       saturation, heart rate, blood pressure, respiratory rate, EKG, adequacy       of pulmonary ventilation, and response to care. Recommendation:           - Patient has a contact number available for                            emergencies. The signs and symptoms of potential                            delayed complications were discussed with the                            patient. Return to  normal activities tomorrow.                            Written discharge instructions were provided to the                            patient.                           - Advance diet as tolerated.                           - Continue present medications. Procedure Code(s):        --- Professional ---                           [phone], Esophagogastroduodenoscopy, flexible,                            transoral; diagnostic, including collection of                            specimen(s) by brushing or washing, when performed                            (separate procedure)                           43450, Dilation of esophagus, by unguided sound or                            bougie, single or multiple passes Diagnosis Code(s):        --- Professional ---                           R13.10, Dysphagia, unspecified CPT copyright 2022 American [employer] Association. All rights reserved. The codes documented in this report are preliminary and upon coder review may  be revised to  meet current compliance requirements. Gerrit Friends. Nathanial Arrighi, MD Gennette Pac, MD 07/03/2022 8:07:03 PM This report has been signed electronically. Number of Addenda: 0

## 2022-07-04 DIAGNOSIS — L430 Hypertrophic lichen planus: Secondary | ICD-10-CM | POA: Diagnosis not present

## 2022-07-04 DIAGNOSIS — E7849 Other hyperlipidemia: Secondary | ICD-10-CM | POA: Diagnosis not present

## 2022-07-04 DIAGNOSIS — I10 Essential (primary) hypertension: Secondary | ICD-10-CM | POA: Diagnosis not present

## 2022-07-04 DIAGNOSIS — F17210 Nicotine dependence, cigarettes, uncomplicated: Secondary | ICD-10-CM | POA: Diagnosis not present

## 2022-07-04 DIAGNOSIS — M797 Fibromyalgia: Secondary | ICD-10-CM | POA: Diagnosis not present

## 2022-07-04 DIAGNOSIS — Z79891 Long term (current) use of opiate analgesic: Secondary | ICD-10-CM | POA: Diagnosis not present

## 2022-07-04 DIAGNOSIS — R4582 Worries: Secondary | ICD-10-CM | POA: Diagnosis not present

## 2022-07-04 DIAGNOSIS — D72829 Elevated white blood cell count, unspecified: Secondary | ICD-10-CM | POA: Diagnosis not present

## 2022-07-04 DIAGNOSIS — M05711 Rheumatoid arthritis with rheumatoid factor of right shoulder without organ or systems involvement: Secondary | ICD-10-CM | POA: Diagnosis not present

## 2022-07-04 DIAGNOSIS — K2100 Gastro-esophageal reflux disease with esophagitis, without bleeding: Secondary | ICD-10-CM | POA: Diagnosis not present

## 2022-07-04 DIAGNOSIS — E1169 Type 2 diabetes mellitus with other specified complication: Secondary | ICD-10-CM | POA: Diagnosis not present

## 2022-07-04 DIAGNOSIS — E559 Vitamin D deficiency, unspecified: Secondary | ICD-10-CM | POA: Diagnosis not present

## 2022-07-17 ENCOUNTER — Encounter (HOSPITAL_COMMUNITY): Payer: Self-pay | Admitting: Internal Medicine

## 2022-07-22 DIAGNOSIS — R5383 Other fatigue: Secondary | ICD-10-CM | POA: Diagnosis not present

## 2022-07-22 DIAGNOSIS — M0579 Rheumatoid arthritis with rheumatoid factor of multiple sites without organ or systems involvement: Secondary | ICD-10-CM | POA: Diagnosis not present

## 2022-07-22 DIAGNOSIS — Z6841 Body Mass Index (BMI) 40.0 and over, adult: Secondary | ICD-10-CM | POA: Diagnosis not present

## 2022-07-22 DIAGNOSIS — M797 Fibromyalgia: Secondary | ICD-10-CM | POA: Diagnosis not present

## 2022-07-22 DIAGNOSIS — L439 Lichen planus, unspecified: Secondary | ICD-10-CM | POA: Diagnosis not present

## 2022-07-22 DIAGNOSIS — G8929 Other chronic pain: Secondary | ICD-10-CM | POA: Diagnosis not present

## 2022-07-22 DIAGNOSIS — M5136 Other intervertebral disc degeneration, lumbar region: Secondary | ICD-10-CM | POA: Diagnosis not present

## 2022-07-22 DIAGNOSIS — D72828 Other elevated white blood cell count: Secondary | ICD-10-CM | POA: Diagnosis not present

## 2022-07-31 ENCOUNTER — Inpatient Hospital Stay: Payer: Managed Care, Other (non HMO)

## 2022-08-05 ENCOUNTER — Other Ambulatory Visit: Payer: Self-pay | Admitting: Urology

## 2022-08-05 DIAGNOSIS — N3021 Other chronic cystitis with hematuria: Secondary | ICD-10-CM

## 2022-08-08 ENCOUNTER — Inpatient Hospital Stay: Payer: Managed Care, Other (non HMO) | Admitting: Physician Assistant

## 2022-08-27 ENCOUNTER — Ambulatory Visit (INDEPENDENT_AMBULATORY_CARE_PROVIDER_SITE_OTHER): Payer: Managed Care, Other (non HMO)

## 2022-08-27 DIAGNOSIS — R300 Dysuria: Secondary | ICD-10-CM

## 2022-08-27 NOTE — Progress Notes (Signed)
Patient in office for urine drop off due to burning with urination. Patient was only able to leave enough urine for culture.

## 2022-08-29 ENCOUNTER — Telehealth: Payer: Self-pay

## 2022-08-29 LAB — URINE CULTURE

## 2022-08-29 NOTE — Telephone Encounter (Signed)
-----   Message from Donnita Falls, FNP sent at 08/29/2022 11:28 AM EDT ----- Please notify patient: Negative urine culture, no antibiotic needed at this time. Thanks.

## 2022-08-29 NOTE — Telephone Encounter (Signed)
Patient called with no answer. Message left to return call to office. 

## 2022-08-29 NOTE — Progress Notes (Signed)
Please notify patient: Negative urine culture, no antibiotic needed at this time. Thanks.

## 2022-09-03 ENCOUNTER — Telehealth: Payer: Self-pay

## 2022-09-03 NOTE — Telephone Encounter (Signed)
I contacted patient and she had a urinalysis done with Dr Reuel Boom, and his urinalysis came back that she was infected and he sent in antibiotic for patient. Patient is not sure of the name of the medication but it is for 5 days.

## 2022-09-22 DIAGNOSIS — R350 Frequency of micturition: Secondary | ICD-10-CM | POA: Insufficient documentation

## 2022-09-22 DIAGNOSIS — Z6841 Body Mass Index (BMI) 40.0 and over, adult: Secondary | ICD-10-CM | POA: Insufficient documentation

## 2022-09-22 DIAGNOSIS — N3941 Urge incontinence: Secondary | ICD-10-CM | POA: Insufficient documentation

## 2022-09-22 DIAGNOSIS — R3915 Urgency of urination: Secondary | ICD-10-CM | POA: Insufficient documentation

## 2022-09-22 DIAGNOSIS — R351 Nocturia: Secondary | ICD-10-CM | POA: Insufficient documentation

## 2022-09-22 NOTE — Progress Notes (Unsigned)
Stephanie Huang 06-Feb-1966 [id number]  History of Present Illness: [name] is a 57 y.o. female who presents today for follow up visit at [facility] . - GU history: 1. Recurrent UTI.  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 - Previously took Nitrofurantoin 50 mg nightly for UTI prophylaxis.  - Has active prescription for daily low dose Trimethoprim; denies taking that currently while on acute course of antibiotic as prescribed by PCP. - Denies history of GU stones.  2. OAB with urinary frequency, nocturia, urgency, and urge incontinence. Failed Gemtesa and Myrbetriq.  3. Right renal cyst. Exophytic; measured 15 mm per CT on 06/06/2021.  At last visit with Dr. Ronne Binning on 06/10/2021: The plan was to start Vesicare 10 mg daily.   Urine culture results in past 12 months: - 08/27/2022: Negative  Since last visit: She denies starting Vesicare due to cost / insurance non-coverage.  Today: She reports that she has been having persistent UTI symptoms for 6+ weeks including dysuria, increased urinary urgency, frequency, fatigue, some weakness. Reports bladder pain - worse with filling, briefly relieved with emptying, unaffected by dietary intact. Denies fevers, flank pain, gross hematuria.   She states she has been treated with antibiotics twice by her PCP (Dr. Reuel Huang) and once with Bactrim via telemedicine; states she is currently on antibiotics from Dr. Reuel Huang along with AZO. She states that the urine tests at her PCP office have been negative so she is concerned / wants to know what's happening then. PCP records unavailable for review today.   Fall Screening: Do you usually have a device to assist in your mobility? Yes - cane   Medications: Current Outpatient Medications  Medication Sig Dispense Refill   Abatacept 125 MG/ML SOSY Inject 125 mg into the skin every  7 (seven) days.     acidophilus (RISAQUAD) CAPS capsule Take 1 capsule by mouth daily.     albuterol (VENTOLIN HFA) 108 (90 Base) MCG/ACT inhaler Inhale 1-2 puffs into the lungs every 6 (six) hours as needed for wheezing or shortness of breath.     ANORO ELLIPTA 62.5-25 MCG/INH AEPB Inhale 1 puff into the lungs daily.     aspirin 81 MG chewable tablet Chew 81 mg by mouth daily.     atorvastatin (LIPITOR) 40 MG tablet Take 40 mg by mouth daily.     bisacodyl (DULCOLAX) 5 MG EC tablet Take 2 tablets every night with dinner until bowel movement.  LAXITIVE.  Restart if two days since last bowel movement 30 tablet    buPROPion (WELLBUTRIN XL) 150 MG 24 hr tablet Take 150 mg by mouth in the morning and at bedtime.     citalopram (CELEXA) 20 MG tablet Take 20 mg by mouth daily.      clobetasol ointment (TEMOVATE) 0.05 % Apply 1 Application topically daily as needed (rash).     cyclobenzaprine (FLEXERIL) 10 MG tablet Take 10 mg by mouth 3 (three) times daily as needed for muscle spasms.     diclofenac (VOLTAREN) 75 MG EC tablet Take 75 mg by mouth 2 (two) times daily.     docusate sodium 100 MG CAPS 1 tab 2 times a day while on narcotics.  STOOL SOFTENER 60 capsule 0   DULoxetine (CYMBALTA) 60 MG capsule Take 60 mg by mouth 2 (two) times daily.     folic acid (FOLVITE) 1 MG tablet Take 1 mg by mouth daily.  gabapentin (NEURONTIN) 600 MG tablet Take 600 mg by mouth 3 (three) times daily.     glucosamine-chondroitin 500-400 MG tablet Take 1 tablet by mouth in the morning and at bedtime.     HYDROcodone-acetaminophen (NORCO/VICODIN) 5-325 MG tablet Take 1 tablet by mouth 4 (four) times daily.     hydrocortisone 2.5 % cream Apply 1 Application topically daily as needed (irritation).     JARDIANCE 25 MG TABS tablet Take 25 mg by mouth daily.     lisinopril-hydrochlorothiazide (PRINZIDE,ZESTORETIC) 20-12.5 MG per tablet Take 1 tablet by mouth daily.     meloxicam (MOBIC) 15 MG tablet Take 7.5 mg by mouth  in the morning and at bedtime.     metFORMIN (GLUCOPHAGE-XR) 500 MG 24 hr tablet Take 500 mg by mouth 2 (two) times daily with a meal.     metoprolol succinate (TOPROL-XL) 25 MG 24 hr tablet Take 25 mg by mouth daily.     MOUNJARO 7.5 MG/0.5ML Pen Inject 7.5 mg into the skin once a week.     NON FORMULARY Pt uses a cpap nightly     norethindrone (AYGESTIN) 5 MG tablet Take 5 mg by mouth daily.     omeprazole (PRILOSEC) 20 MG capsule Take 20 mg by mouth daily.     phenazopyridine (PYRIDIUM) 100 MG tablet Take 1 tablet (100 mg total) by mouth 3 (three) times daily as needed for pain. 10 tablet 0   Sod Picosulfate-Mag Ox-Cit Acd (CLENPIQ) 10-3.5-12 MG-GM -GM/175ML SOLN Take 1 kit by mouth as directed. 350 mL 0   tacrolimus (PROGRAF) 1 MG capsule Take 1 mg by mouth 2 (two) times daily as needed (lichen planus). Dissolve 1 mg into a glass of water, swish and spit up to twice daily as needed for lichen planus sores.     topiramate (TOPAMAX) 100 MG tablet Take 100 mg by mouth 2 (two) times daily.     zolpidem (AMBIEN) 10 MG tablet Take 10 mg by mouth at bedtime.     trimethoprim (TRIMPEX) 100 MG tablet TAKE 1 TABLET BY MOUTH AT BEDTIME (Patient not taking: Reported on 09/23/2022) 30 tablet 3   No current facility-administered medications for this visit.    Allergies: Allergies  Allergen Reactions   Methotrexate Derivatives Other (See Comments)    Blisters in mouth   Lyrica [Pregabalin] Nausea And Vomiting and Swelling    Past Medical History:  Diagnosis Date   Anxiety    COPD (chronic obstructive pulmonary disease) (HCC)    Depression    Diabetes (HCC)    Fibromyalgia    Fibromyalgia syndrome    GERD (gastroesophageal reflux disease)    MVHQIONG(295.2)    Mirgraine- January 2014- last one   History of blood transfusion    after C- Section   Hyperlipemia    Hypertension    IBS (irritable bowel syndrome)    Left knee DJD    Lichen planus    Migraines    RA (rheumatoid arthritis)  (HCC)    Recurrent UTI    Sleep apnea    Diagnosed 5 years ago   Past Surgical History:  Procedure Laterality Date   CATARACT EXTRACTION Right    CESAREAN SECTION     COLONOSCOPY WITH PROPOFOL N/A 06/27/2022   Procedure: COLONOSCOPY WITH PROPOFOL;  Surgeon: Corbin Ade, MD;  [facility]: [facility];  Service: Endoscopy;  Laterality: N/A;  7:30 am, asa 3   ESOPHAGOGASTRODUODENOSCOPY (EGD) WITH PROPOFOL N/A 06/27/2022   Procedure: ESOPHAGOGASTRODUODENOSCOPY (EGD) WITH  PROPOFOL;  Surgeon: Corbin Ade, MD;  Location: [facility];  Service: Endoscopy;  Laterality: N/A;   KNEE ARTHROSCOPY Bilateral 09/22/2011   MALONEY DILATION N/A 06/27/2022   Procedure: Elease Hashimoto DILATION;  Surgeon: Corbin Ade, MD;  Location: [facility];  Service: Endoscopy;  Laterality: N/A;   PARTIAL KNEE ARTHROPLASTY Left 05/10/2012   Procedure: UNICOMPARTMENTAL KNEE;  Surgeon: Nilda Simmer, MD;  Location: [facility] OR;  Service: Orthopedics;  Laterality: Left;   POLYPECTOMY  06/27/2022   Procedure: POLYPECTOMY INTESTINAL;  Surgeon: Corbin Ade, MD;  Location: [facility];  Service: Endoscopy;;   TUBAL LIGATION     VAGINAL HYSTERECTOMY     Family History  Problem Relation Age of Onset   Pneumonia Mother    Cancer Father        prostate   Colon cancer Neg Hx    Social History   Socioeconomic History   Marital status: Married    Spouse name: Not on file   Number of children: Not on file   Years of education: Not on file   Highest education level: Not on file  Occupational History   Occupation: disabled  Tobacco Use   Smoking status: Every Day    Packs/day: 1.00    Years: 30.00    Additional pack years: 0.00    Total pack years: 30.00    Types: Cigarettes    Last attempt to quit: 03/05/2012    Years since quitting: 10.5   Smokeless tobacco: Never  Vaping Use   Vaping Use: Never used  Substance and Sexual Activity   Alcohol use: No   Drug use: No   Sexual activity: Yes    Birth  control/protection: Surgical  Other Topics Concern   Not on file  Social History Narrative   Not on file   Social Determinants of Health   Financial Resource Strain: Not on file  Food Insecurity: Not on file  Transportation Needs: Not on file  Physical Activity: Not on file  Stress: Not on file  Social Connections: Not on file  Intimate Partner Violence: Not on file    Review of Systems Constitutional: Patient reports general malaise lntegumentary: Patient denies any rashes or pruritus Cardiovascular: Patient denies chest pain or syncope Respiratory: Patient denies shortness of breath Gastrointestinal: Patient denies nausea, vomiting, constipation, or diarrhea Musculoskeletal: Patient denies muscle cramps or weakness Neurologic: Patient denies convulsions or seizures Psychiatric: Patient denies memory problems Allergic/Immunologic: Patient denies recent allergic reaction(s) Hematologic/Lymphatic: Patient denies bleeding tendencies Endocrine: Patient denies heat/cold intolerance  GU: As per HPI.  OBJECTIVE Vitals:   09/23/22 1116  BP: 116/79  Pulse: 96  Temp: 98.3 F (36.8 C)   There is no height or weight on file to calculate BMI.  Physical Examination  Constitutional: No obvious distress; patient is non-toxic appearing  Cardiovascular: No visible lower extremity edema.  Respiratory: The patient does not have audible wheezing/stridor; respirations do not appear labored  Gastrointestinal: Abdomen non-distended Musculoskeletal: Normal ROM of UEs  Skin: No obvious rashes/open sores  Neurologic: CN 2-12 grossly intact Psychiatric: Answered questions appropriately with normal affect  Hematologic/Lymphatic/Immunologic: No obvious bruises or sites of spontaneous bleeding  UA: negative PVR: 0 ml  ASSESSMENT OAB (overactive bladder) - Plan: Urinalysis, Routine w reflex microscopic, BLADDER SCAN AMB NON-IMAGING  Recurrent UTI - Plan: Urinalysis, Routine w reflex  microscopic, BLADDER SCAN AMB NON-IMAGING, phenazopyridine (PYRIDIUM) 100 MG tablet, DG Abd 1 View, US RENAL  Urinary urgency - Plan: Urinalysis, Routine  w reflex microscopic, BLADDER SCAN AMB NON-IMAGING  Urinary frequency - Plan: Urinalysis, Routine w reflex microscopic, BLADDER SCAN AMB NON-IMAGING  Nocturia - Plan: Urinalysis, Routine w reflex microscopic, BLADDER SCAN AMB NON-IMAGING  Urge incontinence - Plan: Urinalysis, Routine w reflex microscopic, BLADDER SCAN AMB NON-IMAGING  Dysuria - Plan: phenazopyridine (PYRIDIUM) 100 MG tablet, DG Abd 1 View, US RENAL  Bladder pain - Plan: DG Abd 1 View, US RENAL  We discussed possible etiologies for her persistent LUTS and her risk factors for UTI. Requesting relevant records from her PCP's office for review; may complete antibiotic as prescribed by Dr. Reuel Huang for now. Agreed to proceed with MDX urine culture to assess for atypical pathogens and imaging (RUS & KUB) to assess for infectious nidus, hydronephrosis, etc. Advised cystoscopy with Dr. Ronne Binning for further evaluation. Pt verbalized understanding and agreement.   We discussed importance of limiting Pyridium (phenazopyridine) use to 3 days consecutively due to risk for liver injury, methemoglobinemia, and damage to bone health. Attempted to order Uro MP PRN as alternative, however EMR pharmacy alerts indicate high risk when used with some of her other daily medications therefore will not use. Patient aware to use Pyridium sparingly. All questions were answered.  PLAN Advised the following: 1. Requesting PCP records. 2. MDX urine culture 3. RUS & KUB.  4. Pyridium PRN (sparingly).  5. Return for 1st available cystoscopy with Dr. Ronne Binning.  Orders Placed This Encounter  Procedures   DG Abd 1 View    Standing Status:   Future    Standing Expiration Date:   09/23/2023    Order Specific Question:   Reason for Exam (SYMPTOM  OR DIAGNOSIS REQUIRED)    Answer:   kidney stone    Order  Specific Question:   Is patient pregnant?    Answer:   No    Order Specific Question:   Preferred imaging location?    Answer:   [facility]   US RENAL    Standing Status:   Future    Standing Expiration Date:   09/23/2023    Order Specific Question:   Reason for Exam (SYMPTOM  OR DIAGNOSIS REQUIRED)    Answer:   kidney stone known or suspected    Order Specific Question:   Preferred imaging location?    Answer:   [facility]   Urinalysis, Routine w reflex microscopic   BLADDER SCAN AMB NON-IMAGING   Total time spent caring for the patient today was over 40 minutes. This includes time spent on the date of the visit reviewing the patient's chart before the visit, time spent during the visit, and time spent after the visit on documentation. Over 50% of that time was spent in face-to-face time with this patient for direct counseling. E&M based on time and complexity of medical decision making.  It has been explained that the patient is to follow regularly with their PCP in addition to all other providers involved in their care and to follow instructions provided by these respective offices. Patient advised to contact urology clinic if any urologic-pertaining questions, concerns, new symptoms or problems arise in the interim period.  There are no Patient Instructions on file for this visit.  Electronically signed by:  Donnita Falls, FNP   09/23/22    2:09 PM

## 2022-09-23 ENCOUNTER — Encounter: Payer: Self-pay | Admitting: Urology

## 2022-09-23 ENCOUNTER — Ambulatory Visit (INDEPENDENT_AMBULATORY_CARE_PROVIDER_SITE_OTHER): Payer: Managed Care, Other (non HMO) | Admitting: Urology

## 2022-09-23 VITALS — BP 116/79 | HR 96 | Temp 98.3°F

## 2022-09-23 DIAGNOSIS — R350 Frequency of micturition: Secondary | ICD-10-CM

## 2022-09-23 DIAGNOSIS — R1030 Lower abdominal pain, unspecified: Secondary | ICD-10-CM | POA: Diagnosis not present

## 2022-09-23 DIAGNOSIS — R3989 Other symptoms and signs involving the genitourinary system: Secondary | ICD-10-CM

## 2022-09-23 DIAGNOSIS — R351 Nocturia: Secondary | ICD-10-CM | POA: Diagnosis not present

## 2022-09-23 DIAGNOSIS — R300 Dysuria: Secondary | ICD-10-CM | POA: Diagnosis not present

## 2022-09-23 DIAGNOSIS — N390 Urinary tract infection, site not specified: Secondary | ICD-10-CM

## 2022-09-23 DIAGNOSIS — N3281 Overactive bladder: Secondary | ICD-10-CM

## 2022-09-23 DIAGNOSIS — N3080 Other cystitis without hematuria: Secondary | ICD-10-CM | POA: Diagnosis not present

## 2022-09-23 DIAGNOSIS — R3915 Urgency of urination: Secondary | ICD-10-CM

## 2022-09-23 DIAGNOSIS — N3941 Urge incontinence: Secondary | ICD-10-CM

## 2022-09-23 DIAGNOSIS — Z87440 Personal history of urinary (tract) infections: Secondary | ICD-10-CM

## 2022-09-23 LAB — BLADDER SCAN AMB NON-IMAGING: Scan Result: 0

## 2022-09-23 MED ORDER — PHENAZOPYRIDINE HCL 100 MG PO TABS
100.0000 mg | ORAL_TABLET | Freq: Three times a day (TID) | ORAL | 0 refills | Status: AC | PRN
Start: 2022-09-23 — End: ?

## 2022-09-23 NOTE — Progress Notes (Signed)
MDX tracking number [id number] Confirmation number [id number]

## 2022-09-23 NOTE — Progress Notes (Signed)
Pt here today for bladder scan. Bladder was scanned and 0 was visualized.    Performed by Dericka Ostenson, CMA  

## 2022-09-24 LAB — URINALYSIS, ROUTINE W REFLEX MICROSCOPIC
Bilirubin, UA: NEGATIVE
Glucose, UA: NEGATIVE
Ketones, UA: NEGATIVE
Leukocytes,UA: NEGATIVE
Nitrite, UA: NEGATIVE
Protein,UA: NEGATIVE
RBC, UA: NEGATIVE
Specific Gravity, UA: <1.005 — ABNORMAL LOW (ref 1.005–1.030)
Urobilinogen, Ur: 0.2 mg/dL (ref 0.2–1.0)
pH, UA: 6 (ref 5.0–7.5)

## 2022-09-30 ENCOUNTER — Other Ambulatory Visit: Payer: Self-pay | Admitting: Urology

## 2022-09-30 DIAGNOSIS — N390 Urinary tract infection, site not specified: Secondary | ICD-10-CM

## 2022-09-30 DIAGNOSIS — N3000 Acute cystitis without hematuria: Secondary | ICD-10-CM

## 2022-09-30 MED ORDER — LEVOFLOXACIN 250 MG PO TABS
250.00 mg | ORAL_TABLET | Freq: Every day | ORAL | 0 refills | Status: AC
Start: 2022-09-30 — End: 2022-10-05

## 2022-09-30 NOTE — Progress Notes (Signed)
Sent to clinical nursing staff to convey to patient on 09/30/2022:  MDX urine culture on 09/23/2022 positive for Enterobacter cloacae, Enterococcus faecalis, Klebsiella pneumoniae, Staphylococcus epidermis. Multi-drug resistant. Best option appears to be Levaquin, however patient should be made aware there is a risk for cardiac arrhythmia / QT-prolongation with use of Levaquin particularly due to her being on Tacrolimus (Prograf). If still taking Tacrolimus (Prograf) she is advised to check with the prescribing provider to see if she can safely hold that medication for 5 days while taking Levaquin for atypical UTI.   Evette Georges, MSN, FNP-C, [facility] Nurse Practitioner [facility] [place]

## 2022-10-02 ENCOUNTER — Telehealth: Payer: Self-pay

## 2022-10-02 NOTE — Telephone Encounter (Signed)
-----   Message from Donnita Falls sent at 09/30/2022  4:28 PM EDT ----- MDX urine culture on 09/23/2022 positive for Enterobacter cloacae, Enterococcus faecalis, Klebsiella pneumoniae, Staphylococcus epidermis. Multi-drug resistant. Best option appears to be Levaquin, however patient should be made aware there is a risk for cardiac arrhythmia / QT-prolongation with use of Levaquin particularly due to her being on Tacrolimus (Prograf). If still taking Tacrolimus (Prograf) she is advised to check with the prescribing provider to see if she can safely hold that medication for 5 days while taking Levaquin for atypical UTI. Thanks.    ----- Message ----- From: Ferdinand Lango, RN Sent: 09/30/2022   3:51 PM EDT To: Donnita Falls, FNP  Mdx culture

## 2022-10-02 NOTE — Telephone Encounter (Addendum)
Patient made aware of Sarah recommendation. Patient states that she only takes Tacrolimus (Prograf) PRN when her mouth breaks out and she has not been taking the medication. Patient would like to be prescribe Levaquin patient is made aware Levaquin sent to her pharmacy..  Per patient request for information to be faxed to PCP Almond Lint, MD.Patient voiced understanding

## 2022-10-09 ENCOUNTER — Other Ambulatory Visit (HOSPITAL_COMMUNITY): Payer: Managed Care, Other (non HMO)

## 2022-10-14 ENCOUNTER — Ambulatory Visit (HOSPITAL_COMMUNITY): Payer: Managed Care, Other (non HMO) | Attending: Urology

## 2022-10-22 ENCOUNTER — Other Ambulatory Visit: Payer: Managed Care, Other (non HMO) | Admitting: Urology

## 2022-10-22 DIAGNOSIS — N390 Urinary tract infection, site not specified: Secondary | ICD-10-CM

## 2022-11-30 ENCOUNTER — Other Ambulatory Visit: Payer: Self-pay | Admitting: Urology

## 2022-11-30 DIAGNOSIS — Z1152 Encounter for screening for COVID-19: Secondary | ICD-10-CM | POA: Diagnosis not present

## 2022-11-30 DIAGNOSIS — Z7984 Long term (current) use of oral hypoglycemic drugs: Secondary | ICD-10-CM | POA: Diagnosis not present

## 2022-11-30 DIAGNOSIS — E119 Type 2 diabetes mellitus without complications: Secondary | ICD-10-CM | POA: Diagnosis not present

## 2022-11-30 DIAGNOSIS — F17200 Nicotine dependence, unspecified, uncomplicated: Secondary | ICD-10-CM | POA: Diagnosis not present

## 2022-11-30 DIAGNOSIS — N3021 Other chronic cystitis with hematuria: Secondary | ICD-10-CM

## 2022-11-30 DIAGNOSIS — Z79899 Other long term (current) drug therapy: Secondary | ICD-10-CM | POA: Diagnosis not present

## 2022-11-30 DIAGNOSIS — J441 Chronic obstructive pulmonary disease with (acute) exacerbation: Secondary | ICD-10-CM | POA: Diagnosis not present

## 2022-11-30 DIAGNOSIS — M797 Fibromyalgia: Secondary | ICD-10-CM | POA: Diagnosis not present

## 2022-12-17 ENCOUNTER — Encounter: Payer: Self-pay | Admitting: *Deleted

## 2023-01-27 DIAGNOSIS — R7681 Abnormal rheumatoid factor and anti-citrullinated protein antibody without rheumatoid arthritis: Secondary | ICD-10-CM | POA: Insufficient documentation

## 2023-01-27 DIAGNOSIS — R768 Other specified abnormal immunological findings in serum: Secondary | ICD-10-CM | POA: Insufficient documentation

## 2023-01-27 DIAGNOSIS — M2550 Pain in unspecified joint: Secondary | ICD-10-CM | POA: Insufficient documentation

## 2023-01-28 ENCOUNTER — Ambulatory Visit (INDEPENDENT_AMBULATORY_CARE_PROVIDER_SITE_OTHER): Payer: Managed Care, Other (non HMO) | Admitting: Internal Medicine

## 2023-01-28 ENCOUNTER — Encounter: Payer: Self-pay | Admitting: Internal Medicine

## 2023-01-28 ENCOUNTER — Telehealth: Payer: Self-pay | Admitting: Internal Medicine

## 2023-01-28 VITALS — BP 123/79 | HR 98 | Ht 64.0 in | Wt 228.0 lb

## 2023-01-28 DIAGNOSIS — R0609 Other forms of dyspnea: Secondary | ICD-10-CM | POA: Diagnosis not present

## 2023-01-28 DIAGNOSIS — I10 Essential (primary) hypertension: Secondary | ICD-10-CM | POA: Diagnosis not present

## 2023-01-28 DIAGNOSIS — R0600 Dyspnea, unspecified: Secondary | ICD-10-CM

## 2023-01-28 MED ORDER — VALSARTAN-HYDROCHLOROTHIAZIDE 160-12.5 MG PO TABS: 1.0000 | ORAL_TABLET | Freq: Every day | ORAL | 11 refills | Status: AC

## 2023-01-28 NOTE — Progress Notes (Signed)
Stephanie Huang, female    DOB: 1965/06/12    MRN: [id number]   Brief patient profile:  67  yowf  quit smokng 2013   referred to pulmonary clinic in [facility]  01/28/2023 by Donzetta Sprung MD  for surgical clearance for back and knee surgery      History of Present Illness  01/28/2023  Pulmonary/ 1st office eval/ Chancey Ringel / [facility]  on [place] and ACEi  Chief Complaint  Patient presents with   Establish Care    Cough/shob   Surgical Clearance    Knee surgery   Dyspnea:  limited by back / knee rather than doe :  R knee gives out first  Cough: dry cough daytime  Sleep: HOB 10 degrees electric/ 2 pillows  SABA use: once a day 02: none  Lung cancer screen: [place]  last 01/24/22 mild emphysema   No obvious day to day or daytime pattern/variability or assoc excess/ purulent sputum or mucus plugs or hemoptysis or cp or chest tightness, subjective wheeze or overt sinus or hb symptoms.    Also denies any obvious fluctuation of symptoms with weather or environmental changes or other aggravating or alleviating factors except as outlined above   No unusual exposure hx or h/o childhood pna/ asthma or knowledge of premature birth.  Current Allergies, Complete Past Medical History, Past Surgical History, Family History, and Social History were reviewed in [employer] record.  ROS  The following are not active complaints unless bolded Hoarseness, sore throat, dysphagia(globus, mild) , dental problems, itching, sneezing,  nasal congestion or discharge of excess mucus or purulent secretions, ear ache,   fever, chills, sweats, unintended wt loss or wt gain, classically pleuritic or exertional cp,  orthopnea pnd or arm/hand swelling  or leg swelling, presyncope, palpitations, abdominal pain, anorexia, nausea, vomiting, diarrhea  or change in bowel habits or change in bladder habits, change in stools or change in urine, dysuria, hematuria,  rash, arthralgias, visual  complaints, headache, numbness, weakness or ataxia or problems with walking/uses cane or coordination,  change in mood or  memory.            Outpatient Medications Prior to Visit  Medication Sig Dispense Refill   Abatacept 125 MG/ML SOSY Inject 125 mg into the skin every 7 (seven) days.     acidophilus (RISAQUAD) CAPS capsule Take 1 capsule by mouth daily.     albuterol (VENTOLIN HFA) 108 (90 Base) MCG/ACT inhaler Inhale 1-2 puffs into the lungs every 6 (six) hours as needed for wheezing or shortness of breath.     ANORO ELLIPTA 62.5-25 MCG/INH AEPB Inhale 1 puff into the lungs daily.     aspirin 81 MG chewable tablet Chew 81 mg by mouth daily.     atorvastatin (LIPITOR) 40 MG tablet Take 40 mg by mouth daily.     bisacodyl (DULCOLAX) 5 MG EC tablet Take 2 tablets every night with dinner until bowel movement.  LAXITIVE.  Restart if two days since last bowel movement 30 tablet    buPROPion (WELLBUTRIN XL) 150 MG 24 hr tablet Take 150 mg by mouth in the morning and at bedtime.     citalopram (CELEXA) 20 MG tablet Take 20 mg by mouth daily.      clobetasol ointment (TEMOVATE) 0.05 % Apply 1 Application topically daily as needed (rash).     cyclobenzaprine (FLEXERIL) 10 MG tablet Take 10 mg by mouth 3 (three) times daily as needed for muscle spasms.  diclofenac (VOLTAREN) 75 MG EC tablet Take 75 mg by mouth 2 (two) times daily.     docusate sodium 100 MG CAPS 1 tab 2 times a day while on narcotics.  STOOL SOFTENER 60 capsule 0   DULoxetine (CYMBALTA) 60 MG capsule Take 60 mg by mouth 2 (two) times daily.     ezetimibe (ZETIA) 10 MG tablet Take 10 mg by mouth daily.     famotidine (PEPCID) 20 MG tablet Take 20 mg by mouth 2 (two) times daily.     folic acid (FOLVITE) 1 MG tablet Take 1 mg by mouth daily.     furosemide (LASIX) 40 MG tablet Take 40 mg by mouth daily.     gabapentin (NEURONTIN) 600 MG tablet Take 600 mg by mouth 3 (three) times daily.     glucosamine-chondroitin 500-400 MG  tablet Take 1 tablet by mouth in the morning and at bedtime.     HYDROcodone-acetaminophen (NORCO/VICODIN) 5-325 MG tablet Take 1 tablet by mouth 4 (four) times daily.     hydrocortisone 2.5 % cream Apply 1 Application topically daily as needed (irritation).     JARDIANCE 25 MG TABS tablet Take 25 mg by mouth daily.     lisinopril-hydrochlorothiazide (PRINZIDE,ZESTORETIC) 20-12.5 MG per tablet Take 1 tablet by mouth daily.     meloxicam (MOBIC) 15 MG tablet Take 7.5 mg by mouth in the morning and at bedtime.     metFORMIN (GLUCOPHAGE-XR) 500 MG 24 hr tablet Take 500 mg by mouth 2 (two) times daily with a meal.     metoprolol succinate (TOPROL-XL) 25 MG 24 hr tablet Take 25 mg by mouth daily.     MOUNJARO 10 MG/0.5ML Pen Inject 10 mg into the skin once a week.     nitrofurantoin (MACRODANTIN) 50 MG capsule Take 50 mg by mouth at bedtime.     NON FORMULARY Pt uses a cpap nightly     norethindrone (AYGESTIN) 5 MG tablet Take 5 mg by mouth daily.     omeprazole (PRILOSEC) 20 MG capsule Take 20 mg by mouth daily.     ORENCIA CLICKJECT 125 MG/ML SOAJ Inject into the skin.     phenazopyridine (PYRIDIUM) 100 MG tablet Take 1 tablet (100 mg total) by mouth 3 (three) times daily as needed for pain. 10 tablet 0   Sod Picosulfate-Mag Ox-Cit Acd (CLENPIQ) 10-3.5-12 MG-GM -GM/175ML SOLN Take 1 kit by mouth as directed. 350 mL 0   tacrolimus (PROGRAF) 1 MG capsule Take 1 mg by mouth 2 (two) times daily as needed (lichen planus). Dissolve 1 mg into a glass of water, swish and spit up to twice daily as needed for lichen planus sores.     topiramate (TOPAMAX) 100 MG tablet Take 100 mg by mouth 2 (two) times daily.     trimethoprim (TRIMPEX) 100 MG tablet TAKE 1 TABLET BY MOUTH AT BEDTIME 30 tablet 3   Vitamin D, Ergocalciferol, 50000 units CAPS Take 1 capsule by mouth once a week.     zolpidem (AMBIEN) 10 MG tablet Take 10 mg by mouth at bedtime.     MOUNJARO 7.5 MG/0.5ML Pen Inject 7.5 mg into the skin once a  week.     No facility-administered medications prior to visit.    Past Medical History:  Diagnosis Date   Anxiety    COPD (chronic obstructive pulmonary disease) (HCC)    Depression    Diabetes (HCC)    Fibromyalgia    Fibromyalgia syndrome    GERD (  gastroesophageal reflux disease)    Headache(784.0)    Mirgraine- January 2014- last one   History of blood transfusion    after C- Section   Hyperlipemia    Hypertension    IBS (irritable bowel syndrome)    Left knee DJD    Lichen planus    Migraines    RA (rheumatoid arthritis) (HCC)    Recurrent UTI    Sleep apnea    Diagnosed 5 years ago      Objective:     BP 123/79   Pulse 98   Ht 5\' 4"  (1.626 m)   Wt 228 lb (103.4 kg)   SpO2 95%   BMI 39.14 kg/m   SpO2: 95 % RA   Amb mod obese wf /hoarse with mild pw/ walks with cane    HEENT : Oropharynx  clear M3-4 airway   Nasal turbinates nl    NECK :  without  apparent JVD/ palpable Nodes/TM    LUNGS: no acc muscle use,  Min barrel  contour chest wall with bilateral  slightly decreased bs s audible wheeze and  without cough on insp or exp maneuvers and min  Hyperresonant  to  percussion bilaterally    CV:  RRR  no s3 or murmur or increase in P2, and no edema   ABD: quite obese but soft and nontender  with limited excursion in supine position  MS:  Nl gait/ ext warm without deformities Or obvious joint restrictions  calf tenderness, cyanosis or clubbing    SKIN: warm and dry without lesions    NEURO:  alert, approp, nl sensorium with  no motor or cerebellar deficits apparent.          I personally reviewed images and agree with radiology impression as follows:  CXR:   pa and lateral 11/30/22 :  wnl     Assessment   DOE (dyspnea on exertion) Quit smoking 2013 s limiting sob or need for saba @ wt around 200  - preop eval 01/28/2023 on ACEi and anoro with pseudowheeze on exam > try off both  -  01/28/2023  @ wt 228  Walked on RA  x  2   lap(s) =  approx 300  ft   @ slow/ cane pace, stopped due to fatigue / R knee pain s sob  with lowest 02 sats 94% with improvement to 96% prior to stopping.     When respiratory symptoms begin or become refractory well after a patient reports complete smoking cessation,  Especially when this wasn't the case while they were smoking, a red flag is raised based on the work of Dr Primitivo Gauze which states:  if you quit smoking when your best day FEV1 is still well preserved it is highly unlikely you will progress to severe disease.  That is to say, once the smoking stops,  the symptoms should not suddenly erupt or markedly worsen.  If so, the differential diagnosis should include  obesity/deconditioning,  LPR/Reflux/Aspiration syndromes,  occult CHF, or  especially side effect of medications commonly used in this population esp ACEi but also here concerned re risk of macrodantin pulmonary toxicity.  Rec Try off acei and then off anoro before return for pfts and final clearance  Advised on need for early mobility / min narcs post op due to baseline limited isnp excursion supine due to obesity Should be able to clear for surgery w/in a few weeks    Essential hypertension In the best review of chronic  cough to date ( NEJM 2016 375 [id number]) ,  ACEi are now felt to cause cough in up to  20% (typically dry daytime) of pts which is a 4 fold increase from previous reports and does not include the variety of non-specific complaints we see in pulmonary clinic in pts on ACEi but previously attributed to another dx like  Copd/asthma and  include PNDS, throat and chest congestion, "bronchitis", unexplained dyspnea and choking sensations, and hoarseness, but also  atypical /refractory GERD symptoms like dysphagia and "bad heartburn"   The only way I know  to prove this is not an "ACEi Case" is a trial off ACEi x a minimum of 2 weeks then regroup.    Try valsartan 160 -12.5 daily and f/u with PCP   Each maintenance medication was reviewed in  detail including emphasizing most importantly the difference between maintenance and prns and under what circumstances the prns are to be triggered using an action plan format where appropriate.  Total time for H and P, chart review, counseling,  directly observing portions of ambulatory 02 saturation study/ and generating customized AVS unique to this office visit / same day charting = 46 min with pt new to me                Sandrea Hughs, MD 01/28/2023

## 2023-01-28 NOTE — Assessment & Plan Note (Signed)
Quit smoking 2013 s limiting sob or need for saba @ wt around 200  - preop eval 01/28/2023 on ACEi and anoro with pseudowheeze on exam > try off both  -  01/28/2023  @ wt 228  Walked on RA  x  2   lap(s) =  approx 300  ft  @ slow/ cane pace, stopped due to fatigue / R knee pain s sob  with lowest 02 sats 94% with improvement to 96% prior to stopping.     When respiratory symptoms begin or become refractory well after a patient reports complete smoking cessation,  Especially when this wasn't the case while they were smoking, a red flag is raised based on the work of Dr Primitivo Gauze which states:  if you quit smoking when your best day FEV1 is still well preserved it is highly unlikely you will progress to severe disease.  That is to say, once the smoking stops,  the symptoms should not suddenly erupt or markedly worsen.  If so, the differential diagnosis should include  obesity/deconditioning,  LPR/Reflux/Aspiration syndromes,  occult CHF, or  especially side effect of medications commonly used in this population esp ACEi but also here concerned re risk of macrodantin pulmonary toxicity.  Rec Try off acei and then off anoro before return for pfts and final clearance  Advised on need for early mobility / min narcs post op due to baseline limited isnp excursion supine due to obesity Should be able to clear for surgery w/in a few weeks

## 2023-01-28 NOTE — Patient Instructions (Addendum)
We will walk you today to document your oxygen level at baseline preop  Stop lisinopril now and stop your Anoro Monday 02/02/23 and continue your albuterol as needed   Valsartan 160-12.5 one daily in place of lisinopril   My office will be contacting you by phone for referral for pre op PFTs next available  - if you don't hear back from my office within one week please call us back or notify us thru MyChart and we'll address it right away.   Pulmonary follow up is as needed if still having cough or breathing / choking problems off anoro and lisinopril

## 2023-01-28 NOTE — Telephone Encounter (Signed)
Spoke with patient regarding th Tuesday 02/17/23 3:00pm PFT appointment at [facility] time is 2:4 45 pm at the 1st floor registration desk for check in---will mail information to patient and she voiced her understanding.

## 2023-01-28 NOTE — Assessment & Plan Note (Addendum)
In the best review of chronic cough to date ( NEJM 2016 375 [phone]) ,  ACEi are now felt to cause cough in up to  20% (typically dry daytime) of pts which is a 4 fold increase from previous reports and does not include the variety of non-specific complaints we see in pulmonary clinic in pts on ACEi but previously attributed to another dx like  Copd/asthma and  include PNDS, throat and chest congestion, "bronchitis", unexplained dyspnea and choking sensations, and hoarseness, but also  atypical /refractory GERD symptoms like dysphagia and "bad heartburn"   The only way I know  to prove this is not an "ACEi Case" is a trial off ACEi x a minimum of 2 weeks then regroup.    Try valsartan 160 -12.5 daily and f/u with PCP   Each maintenance medication was reviewed in detail including emphasizing most importantly the difference between maintenance and prns and under what circumstances the prns are to be triggered using an action plan format where appropriate.  Total time for H and P, chart review, counseling,  directly observing portions of ambulatory 02 saturation study/ and generating customized AVS unique to this office visit / same day charting = 46 min with pt new to me

## 2023-02-04 ENCOUNTER — Encounter: Payer: Self-pay | Admitting: Internal Medicine

## 2023-02-04 ENCOUNTER — Ambulatory Visit: Payer: Managed Care, Other (non HMO) | Attending: Internal Medicine | Admitting: Internal Medicine

## 2023-02-04 VITALS — BP 124/70 | HR 92 | Ht 64.0 in | Wt 227.8 lb

## 2023-02-04 DIAGNOSIS — I10 Essential (primary) hypertension: Secondary | ICD-10-CM | POA: Diagnosis not present

## 2023-02-04 DIAGNOSIS — Z01810 Encounter for preprocedural cardiovascular examination: Secondary | ICD-10-CM | POA: Diagnosis not present

## 2023-02-04 DIAGNOSIS — I2510 Atherosclerotic heart disease of native coronary artery without angina pectoris: Secondary | ICD-10-CM | POA: Insufficient documentation

## 2023-02-04 NOTE — Patient Instructions (Addendum)
 Medication Instructions:  Your physician recommends that you continue on your current medications as directed. Please refer to the Current Medication list given to you today.   Labwork: None  Testing/Procedures: Your physician has requested that you have a lexiscan myoview. For further information please visit [email or web address] Please follow instruction sheet, as given.   Follow-Up: Your physician recommends that you schedule a follow-up appointment in: Pending Results  Any Other Special Instructions Will Be Listed Below (If Applicable). Thank you for choosing [facility]!     If you need a refill on your cardiac medications before your next appointment, please call your pharmacy.

## 2023-02-04 NOTE — Progress Notes (Signed)
Cardiology Office Note  Date: 02/04/2023   ID: Stephanie Huang, DOB 1965-04-30, MRN [id number]  PCP:  Richardean Chimera, MD  Cardiologist:  Marjo Bicker, MD Electrophysiologist:  None   History of Present Illness: Stephanie Huang is a 57 y.o. female known to have HTN, coronary calcifications, diabetes mellitus was referred to cardiology for preop cardiac risk stratification for knee replacement surgery.  METS less than 4, denies any symptoms of angina, DOE, orthopnea, PND, leg swelling.  CT chest lung cancer screening from 11/23 showed age advanced coronary calcifications.  Currently on aspirin and high intensity statin.  No other symptoms of palpitations, dizziness, presyncope and syncope.  She currently has a lot of stress going on in her life, has a court hearing next week and  that her heart rate has always been on the higher side due to stress.  Currently on metoprolol.   Past Medical History:  Diagnosis Date   Anxiety    COPD (chronic obstructive pulmonary disease) (HCC)    Depression    Diabetes (HCC)    Fibromyalgia    Fibromyalgia syndrome    GERD (gastroesophageal reflux disease)    Headache(784.0)    Mirgraine- January 2014- last one   History of blood transfusion    after C- Section   Hyperlipemia    Hypertension    IBS (irritable bowel syndrome)    Left knee DJD    Lichen planus    Migraines    RA (rheumatoid arthritis) (HCC)    Recurrent UTI    Sleep apnea    Diagnosed 5 years ago    Past Surgical History:  Procedure Laterality Date   CATARACT EXTRACTION Right    CESAREAN SECTION     COLONOSCOPY WITH PROPOFOL N/A 06/27/2022   Procedure: COLONOSCOPY WITH PROPOFOL;  Surgeon: Corbin Ade, MD;  Location: [facility];  Service: Endoscopy;  Laterality: N/A;  7:30 am, asa 3   ESOPHAGOGASTRODUODENOSCOPY (EGD) WITH PROPOFOL N/A 06/27/2022   Procedure: ESOPHAGOGASTRODUODENOSCOPY (EGD) WITH PROPOFOL;  Surgeon: Corbin Ade, MD;  Location: [facility];  Service: Endoscopy;  Laterality: N/A;   KNEE ARTHROSCOPY Bilateral 09/22/2011   MALONEY DILATION N/A 06/27/2022   Procedure: Elease Hashimoto DILATION;  Surgeon: Corbin Ade, MD;  Location: [facility];  Service: Endoscopy;  Laterality: N/A;   PARTIAL KNEE ARTHROPLASTY Left 05/10/2012   Procedure: UNICOMPARTMENTAL KNEE;  Surgeon: Nilda Simmer, MD;  Location: [facility] OR;  Service: Orthopedics;  Laterality: Left;   POLYPECTOMY  06/27/2022   Procedure: POLYPECTOMY INTESTINAL;  Surgeon: Corbin Ade, MD;  Location: [facility];  Service: Endoscopy;;   TUBAL LIGATION     VAGINAL HYSTERECTOMY      Current Outpatient Medications  Medication Sig Dispense Refill   Abatacept 125 MG/ML SOSY Inject 125 mg into the skin every 7 (seven) days.     acidophilus (RISAQUAD) CAPS capsule Take 1 capsule by mouth daily.     albuterol (VENTOLIN HFA) 108 (90 Base) MCG/ACT inhaler Inhale 1-2 puffs into the lungs every 6 (six) hours as needed for wheezing or shortness of breath.     aspirin 81 MG chewable tablet Chew 81 mg by mouth daily.     atorvastatin (LIPITOR) 40 MG tablet Take 40 mg by mouth daily.     bisacodyl (DULCOLAX) 5 MG EC tablet Take 2 tablets every night with dinner until bowel movement.  LAXITIVE.  Restart if two days since last bowel movement 30  tablet    buPROPion (WELLBUTRIN XL) 150 MG 24 hr tablet Take 150 mg by mouth in the morning and at bedtime.     citalopram (CELEXA) 20 MG tablet Take 20 mg by mouth daily.      clobetasol ointment (TEMOVATE) 0.05 % Apply 1 Application topically daily as needed (rash).     cyclobenzaprine (FLEXERIL) 10 MG tablet Take 10 mg by mouth 3 (three) times daily as needed for muscle spasms.     diclofenac (VOLTAREN) 75 MG EC tablet Take 75 mg by mouth 2 (two) times daily.     docusate sodium 100 MG CAPS 1 tab 2 times a day while on narcotics.  STOOL SOFTENER 60 capsule 0   DULoxetine (CYMBALTA) 60 MG capsule Take 60 mg by mouth 2 (two) times daily.      ezetimibe (ZETIA) 10 MG tablet Take 10 mg by mouth daily.     famotidine (PEPCID) 20 MG tablet Take 20 mg by mouth 2 (two) times daily.     folic acid (FOLVITE) 1 MG tablet Take 1 mg by mouth daily.     furosemide (LASIX) 40 MG tablet Take 40 mg by mouth daily.     gabapentin (NEURONTIN) 600 MG tablet Take 600 mg by mouth 3 (three) times daily.     glucosamine-chondroitin 500-400 MG tablet Take 1 tablet by mouth in the morning and at bedtime.     HYDROcodone-acetaminophen (NORCO/VICODIN) 5-325 MG tablet Take 1 tablet by mouth 4 (four) times daily.     hydrocortisone 2.5 % cream Apply 1 Application topically daily as needed (irritation).     JARDIANCE 25 MG TABS tablet Take 25 mg by mouth daily.     meloxicam (MOBIC) 15 MG tablet Take 7.5 mg by mouth in the morning and at bedtime.     metFORMIN (GLUCOPHAGE-XR) 500 MG 24 hr tablet Take 500 mg by mouth 2 (two) times daily with a meal.     metoprolol succinate (TOPROL-XL) 25 MG 24 hr tablet Take 25 mg by mouth daily.     MOUNJARO 7.5 MG/0.5ML Pen Inject 7.5 mg into the skin once a week.     nitrofurantoin (MACRODANTIN) 50 MG capsule Take 50 mg by mouth at bedtime.     NON FORMULARY Pt uses a cpap nightly     norethindrone (AYGESTIN) 5 MG tablet Take 5 mg by mouth daily.     omeprazole (PRILOSEC) 20 MG capsule Take 20 mg by mouth daily.     ORENCIA CLICKJECT 125 MG/ML SOAJ Inject into the skin.     phenazopyridine (PYRIDIUM) 100 MG tablet Take 1 tablet (100 mg total) by mouth 3 (three) times daily as needed for pain. 10 tablet 0   Sod Picosulfate-Mag Ox-Cit Acd (CLENPIQ) 10-3.5-12 MG-GM -GM/175ML SOLN Take 1 kit by mouth as directed. 350 mL 0   tacrolimus (PROGRAF) 1 MG capsule Take 1 mg by mouth 2 (two) times daily as needed (lichen planus). Dissolve 1 mg into a glass of water, swish and spit up to twice daily as needed for lichen planus sores.     topiramate (TOPAMAX) 100 MG tablet Take 100 mg by mouth 2 (two) times daily.     trimethoprim  (TRIMPEX) 100 MG tablet TAKE 1 TABLET BY MOUTH AT BEDTIME 30 tablet 3   valsartan-hydrochlorothiazide (DIOVAN HCT) 160-12.5 MG tablet Take 1 tablet by mouth daily. 30 tablet 11   Vitamin D, Ergocalciferol, 50000 units CAPS Take 1 capsule by mouth once a week.  zolpidem (AMBIEN) 10 MG tablet Take 10 mg by mouth at bedtime.     No current facility-administered medications for this visit.   Allergies:  Methotrexate derivatives and Lyrica [pregabalin]   Social History: The patient  reports that she has been smoking cigarettes. She started smoking about 40 years ago. She has a 30 pack-year smoking history. She has never used smokeless tobacco. She reports that she does not drink alcohol and does not use drugs.   Family History: The patient's family history includes Cancer in her father; Pneumonia in her mother.   ROS:  Please see the history of present illness. Otherwise, complete review of systems is positive for none  All other systems are reviewed and negative.   Physical Exam: VS:  BP 124/70   Pulse 92   Ht 5\' 4"  (1.626 m)   Wt 227 lb 12.8 oz (103.3 kg)   SpO2 92%   BMI 39.10 kg/m , BMI Body mass index is 39.1 kg/m.  Wt Readings from Last 3 Encounters:  02/04/23 227 lb 12.8 oz (103.3 kg)  01/28/23 228 lb (103.4 kg)  06/23/22 238 lb 12.1 oz (108.3 kg)    General: Patient appears comfortable at rest. HEENT: Conjunctiva and lids normal, oropharynx clear with moist mucosa. Neck: Supple, no elevated JVP or carotid bruits, no thyromegaly. Lungs: Clear to auscultation, nonlabored breathing at rest. Cardiac: Regular rate and rhythm, no S3 or significant systolic murmur, no pericardial rub. Abdomen: Soft, nontender, no hepatomegaly, bowel sounds present, no guarding or rebound. Extremities: No pitting edema, distal pulses 2+. Skin: Warm and dry. Musculoskeletal: No kyphosis. Neuropsychiatric: Alert and oriented x3, affect grossly appropriate.  Recent Labwork: 02/21/2022: Hemoglobin  15.6; Platelets 404 06/23/2022: BUN 9; Creatinine, Ser 0.89; Potassium 4.0; Sodium 135  No results found for: "CHOL", "TRIG", "HDL", "CHOLHDL", "VLDL", "LDLCALC", "LDLDIRECT"    Assessment and Plan:  Preop cardiac risk stratification for knee surgery Imaging evidence of age advanced coronary artery calcifications HTN, controlled Non-insulin-dependent diabetes mellitus   -METS less than 4, no symptoms of angina or DOE but CT chest showed age advanced coronary calcifications.  Will obtain Lexiscan for preop clearance. Continue cardioprotective medications, aspirin 81 mg once daily, atorvastatin 40 mg nightly.  Goal LDL less than 70. -Continue current antihypertensives, p.o. Lasix 40 mg once daily, metoprolol succinate 25 mg once daily.  Patient reported that she has a lot of stress going on in her family and hence her HR has always been on the higher side.   I spent a total duration of 45 minutes during the prior imaging studies, notes, labs, discussed symptoms of CAD and preop recommendations, documenting the findings in the note.     Medication Adjustments/Labs and Tests Ordered: Current medicines are reviewed at length with the patient today.  Concerns regarding medicines are outlined above.    Disposition:  Follow up  pending results  Signed Osbaldo Mark Verne Spurr, MD, 02/04/2023 4:46 PM    [facility] at [facility] [address] [place], [place], [place] [address]

## 2023-02-05 ENCOUNTER — Telehealth: Payer: Self-pay | Admitting: Internal Medicine

## 2023-02-05 NOTE — Telephone Encounter (Signed)
Patient is calling in to schedule her stress test. Central schedule stated they didn't schedule these. Please advise

## 2023-02-06 ENCOUNTER — Telehealth: Payer: Self-pay | Admitting: Internal Medicine

## 2023-02-06 NOTE — Telephone Encounter (Signed)
Checking percert on the following patient for testing scheduled at [facility].    LEXISCAN   02/13/2023

## 2023-02-11 DIAGNOSIS — Z122 Encounter for screening for malignant neoplasm of respiratory organs: Secondary | ICD-10-CM | POA: Diagnosis not present

## 2023-02-11 DIAGNOSIS — F17210 Nicotine dependence, cigarettes, uncomplicated: Secondary | ICD-10-CM | POA: Diagnosis not present

## 2023-02-13 ENCOUNTER — Encounter (HOSPITAL_COMMUNITY)
Admission: RE | Admit: 2023-02-13 | Discharge: 2023-02-13 | Disposition: A | Discharge status: Home / Self Care | Payer: Managed Care, Other (non HMO) | Source: Ambulatory Visit | Attending: Internal Medicine | Admitting: Internal Medicine

## 2023-02-13 ENCOUNTER — Ambulatory Visit (HOSPITAL_COMMUNITY)
Admission: RE | Admit: 2023-02-13 | Discharge: 2023-02-13 | Disposition: A | Discharge status: Home / Self Care | Payer: Managed Care, Other (non HMO) | Source: Ambulatory Visit | Attending: Internal Medicine | Admitting: Internal Medicine

## 2023-02-13 ENCOUNTER — Encounter (HOSPITAL_COMMUNITY): Payer: Self-pay

## 2023-02-13 DIAGNOSIS — Z01810 Encounter for preprocedural cardiovascular examination: Secondary | ICD-10-CM | POA: Diagnosis not present

## 2023-02-13 DIAGNOSIS — I10 Essential (primary) hypertension: Secondary | ICD-10-CM | POA: Insufficient documentation

## 2023-02-13 HISTORY — DX: Heart failure, unspecified: I50.9

## 2023-02-13 HISTORY — DX: Systemic involvement of connective tissue, unspecified: M35.9

## 2023-02-13 LAB — NM MYOCAR MULTI W/SPECT W/WALL MOTION / EF
Base ST Depression (mm): 0 mm
LV dias vol: 78 mL (ref 46–106)
LV sys vol: 28 mL
Nuc Stress EF: 64 %
Peak HR: 93 {beats}/min
RATE: 0.6
Rest HR: 78 {beats}/min
Rest Nuclear Isotope Dose: 10.5 mCi
SDS: 4
SRS: 2
SSS: 6
ST Depression (mm): 0 mm
Stress Nuclear Isotope Dose: 33 mCi
TID: 1.12

## 2023-02-13 MED ORDER — TECHNETIUM TC 99M TETROFOSMIN IV KIT
10.0000 | PACK | Freq: Once | INTRAVENOUS | Status: AC | PRN
  Administered 2023-02-13: 10.5 via INTRAVENOUS

## 2023-02-13 MED ORDER — SODIUM CHLORIDE FLUSH 0.9 % IV SOLN
INTRAVENOUS | Status: AC
  Administered 2023-02-13: 10 mL via INTRAVENOUS
  Filled 2023-02-13: qty 10

## 2023-02-13 MED ORDER — REGADENOSON 0.4 MG/5ML IV SOLN
INTRAVENOUS | Status: AC
  Administered 2023-02-13: 0.4 mg via INTRAVENOUS
  Filled 2023-02-13: qty 5

## 2023-02-13 MED ORDER — TECHNETIUM TC 99M TETROFOSMIN IV KIT
30.0000 | PACK | Freq: Once | INTRAVENOUS | Status: AC | PRN
  Administered 2023-02-13: 33 via INTRAVENOUS

## 2023-02-17 ENCOUNTER — Ambulatory Visit (HOSPITAL_COMMUNITY)
Admission: RE | Admit: 2023-02-17 | Discharge: 2023-02-17 | Disposition: A | Discharge status: Home / Self Care | Payer: Managed Care, Other (non HMO) | Source: Ambulatory Visit | Attending: Internal Medicine | Admitting: Internal Medicine

## 2023-02-17 ENCOUNTER — Telehealth: Payer: Self-pay

## 2023-02-17 DIAGNOSIS — R0600 Dyspnea, unspecified: Secondary | ICD-10-CM | POA: Diagnosis not present

## 2023-02-17 LAB — PULMONARY FUNCTION TEST
DL/VA % pred: 119 %
DL/VA: 5.04 ml/min/mmHg/L
DLCO unc % pred: 71 %
DLCO unc: 14.62 ml/min/mmHg
FEF 25-75 Post: 1.25 L/s
FEF 25-75 Pre: 1.64 L/s
FEF2575-%Change-Post: -23 %
FEF2575-%Pred-Post: 50 %
FEF2575-%Pred-Pre: 66 %
FEV1-%Change-Post: -6 %
FEV1-%Pred-Post: 56 %
FEV1-%Pred-Pre: 60 %
FEV1-Post: 1.49 L
FEV1-Pre: 1.59 L
FEV1FVC-%Change-Post: 2 %
FEV1FVC-%Pred-Pre: 102 %
FEV6-%Change-Post: -8 %
FEV6-%Pred-Post: 55 %
FEV6-%Pred-Pre: 60 %
FEV6-Post: 1.81 L
FEV6-Pre: 1.97 L
FEV6FVC-%Pred-Post: 103 %
FEV6FVC-%Pred-Pre: 103 %
FVC-%Change-Post: -8 %
FVC-%Pred-Post: 53 %
FVC-%Pred-Pre: 58 %
FVC-Post: 1.81 L
FVC-Pre: 1.97 L
Post FEV1/FVC ratio: 82 %
Post FEV6/FVC ratio: 100 %
Pre FEV1/FVC ratio: 80 %
Pre FEV6/FVC Ratio: 100 %
RV % pred: 86 %
RV: 1.67 L
TLC % pred: 72 %
TLC: 3.65 L

## 2023-02-17 MED ORDER — ALBUTEROL SULFATE (2.5 MG/3ML) 0.083% IN NEBU
2.5000 mg | INHALATION_SOLUTION | Freq: Once | RESPIRATORY_TRACT | Status: AC
  Administered 2023-02-17: 2.5 mg via RESPIRATORY_TRACT

## 2023-02-17 NOTE — Telephone Encounter (Signed)
-----   Message from Vishnu P Mallipeddi sent at 02/17/2023 12:25 PM EST ----- Multiple artifacts and no ischemia. Low risk study. Patient at low risk for knee surgery.

## 2023-02-17 NOTE — Telephone Encounter (Signed)
Patient informed and verbalized understanding of plan. 

## 2023-03-29 ENCOUNTER — Other Ambulatory Visit: Payer: Self-pay | Admitting: Urology

## 2023-03-29 DIAGNOSIS — N3021 Other chronic cystitis with hematuria: Secondary | ICD-10-CM

## 2023-04-23 DIAGNOSIS — Z6839 Body mass index (BMI) 39.0-39.9, adult: Secondary | ICD-10-CM | POA: Diagnosis not present

## 2023-04-23 DIAGNOSIS — G8929 Other chronic pain: Secondary | ICD-10-CM | POA: Diagnosis not present

## 2023-04-23 DIAGNOSIS — D72828 Other elevated white blood cell count: Secondary | ICD-10-CM | POA: Diagnosis not present

## 2023-04-23 DIAGNOSIS — M25519 Pain in unspecified shoulder: Secondary | ICD-10-CM | POA: Diagnosis not present

## 2023-04-23 DIAGNOSIS — L439 Lichen planus, unspecified: Secondary | ICD-10-CM | POA: Diagnosis not present

## 2023-04-23 DIAGNOSIS — M7989 Other specified soft tissue disorders: Secondary | ICD-10-CM | POA: Diagnosis not present

## 2023-04-23 DIAGNOSIS — M0579 Rheumatoid arthritis with rheumatoid factor of multiple sites without organ or systems involvement: Secondary | ICD-10-CM | POA: Diagnosis not present

## 2023-04-23 DIAGNOSIS — R5383 Other fatigue: Secondary | ICD-10-CM | POA: Diagnosis not present

## 2023-04-23 DIAGNOSIS — E669 Obesity, unspecified: Secondary | ICD-10-CM | POA: Diagnosis not present

## 2023-04-23 DIAGNOSIS — M797 Fibromyalgia: Secondary | ICD-10-CM | POA: Diagnosis not present
